# Patient Record
Sex: Male | Born: 1945 | Race: White | Hispanic: No | Marital: Married | Smoking: Former smoker
Health system: Southern US, Community
[De-identification: ages and names within clinical notes are randomized; demographics above are authoritative.]

## PROBLEM LIST (undated history)

## (undated) DIAGNOSIS — I10 Essential (primary) hypertension: Secondary | ICD-10-CM

## (undated) DIAGNOSIS — N429 Disorder of prostate, unspecified: Secondary | ICD-10-CM

## (undated) DIAGNOSIS — N189 Chronic kidney disease, unspecified: Secondary | ICD-10-CM

## (undated) DIAGNOSIS — R32 Unspecified urinary incontinence: Secondary | ICD-10-CM

## (undated) DIAGNOSIS — R011 Cardiac murmur, unspecified: Secondary | ICD-10-CM

## (undated) DIAGNOSIS — E119 Type 2 diabetes mellitus without complications: Secondary | ICD-10-CM

## (undated) DIAGNOSIS — C4492 Squamous cell carcinoma of skin, unspecified: Secondary | ICD-10-CM

## (undated) DIAGNOSIS — K219 Gastro-esophageal reflux disease without esophagitis: Secondary | ICD-10-CM

## (undated) DIAGNOSIS — I639 Cerebral infarction, unspecified: Secondary | ICD-10-CM

## (undated) DIAGNOSIS — L570 Actinic keratosis: Secondary | ICD-10-CM

## (undated) HISTORY — PX: SHOULDER SURGERY: SHX246

## (undated) HISTORY — DX: Actinic keratosis: L57.0

## (undated) HISTORY — DX: Disorder of prostate, unspecified: N42.9

## (undated) HISTORY — DX: Cerebral infarction, unspecified: I63.9

## (undated) HISTORY — DX: Type 2 diabetes mellitus without complications: E11.9

## (undated) HISTORY — DX: Cardiac murmur, unspecified: R01.1

## (undated) HISTORY — DX: Unspecified urinary incontinence: R32

## (undated) HISTORY — DX: Gastro-esophageal reflux disease without esophagitis: K21.9

## (undated) HISTORY — DX: Chronic kidney disease, unspecified: N18.9

## (undated) HISTORY — DX: Essential (primary) hypertension: I10

## (undated) HISTORY — DX: Squamous cell carcinoma of skin, unspecified: C44.92

---

## 2019-11-30 ENCOUNTER — Ambulatory Visit (INDEPENDENT_AMBULATORY_CARE_PROVIDER_SITE_OTHER): Payer: Medicare Other | Admitting: Family Medicine

## 2019-11-30 ENCOUNTER — Other Ambulatory Visit: Payer: Self-pay | Admitting: Family Medicine

## 2019-11-30 ENCOUNTER — Other Ambulatory Visit: Payer: Self-pay

## 2019-11-30 ENCOUNTER — Encounter: Payer: Self-pay | Admitting: Family Medicine

## 2019-11-30 VITALS — BP 120/79 | HR 77 | Temp 97.7°F | Resp 16 | Ht 69.0 in | Wt 187.0 lb

## 2019-11-30 DIAGNOSIS — L570 Actinic keratosis: Secondary | ICD-10-CM | POA: Diagnosis not present

## 2019-11-30 DIAGNOSIS — E1121 Type 2 diabetes mellitus with diabetic nephropathy: Secondary | ICD-10-CM | POA: Insufficient documentation

## 2019-11-30 DIAGNOSIS — N1831 Chronic kidney disease, stage 3a: Secondary | ICD-10-CM

## 2019-11-30 DIAGNOSIS — E1169 Type 2 diabetes mellitus with other specified complication: Secondary | ICD-10-CM

## 2019-11-30 DIAGNOSIS — Z1211 Encounter for screening for malignant neoplasm of colon: Secondary | ICD-10-CM

## 2019-11-30 DIAGNOSIS — N183 Chronic kidney disease, stage 3 unspecified: Secondary | ICD-10-CM

## 2019-11-30 DIAGNOSIS — Z85828 Personal history of other malignant neoplasm of skin: Secondary | ICD-10-CM

## 2019-11-30 DIAGNOSIS — K219 Gastro-esophageal reflux disease without esophagitis: Secondary | ICD-10-CM

## 2019-11-30 DIAGNOSIS — I129 Hypertensive chronic kidney disease with stage 1 through stage 4 chronic kidney disease, or unspecified chronic kidney disease: Secondary | ICD-10-CM | POA: Diagnosis not present

## 2019-11-30 DIAGNOSIS — N138 Other obstructive and reflux uropathy: Secondary | ICD-10-CM

## 2019-11-30 DIAGNOSIS — Z7689 Persons encountering health services in other specified circumstances: Secondary | ICD-10-CM

## 2019-11-30 MED ORDER — ESOMEPRAZOLE MAGNESIUM 20 MG PO CPDR: 20.0000 mg | DELAYED_RELEASE_CAPSULE | Freq: Every day | ORAL | 1 refills | Status: DC

## 2019-11-30 MED ORDER — TRIJARDY XR 25-5-1000 MG PO TB24: 1.0000 | ORAL_TABLET | Freq: Every day | ORAL | 1 refills | Status: DC

## 2019-11-30 MED ORDER — TRIJARDY XR 25-5-1000 MG PO TB24: 1.0000 | ORAL_TABLET | Freq: Every day | ORAL | 0 refills | Status: DC

## 2019-11-30 NOTE — Patient Instructions (Addendum)
Thank you for coming to the office today.  [place]   [address], [place] [address] Hours: 8AM-5PM Phone: [phone]  Sarina Ser, MD  -----------------------------------------------  [facility]) [address] [place], [place] [address] Phone: [phone]    Colon Cancer Screening: - For all adults age 75+ routine colon cancer screening is highly recommended.     - Recent guidelines from [place] recommend starting age of 47 - Early detection of colon cancer is important, because often there are no warning signs or symptoms, also if found early usually it can be cured. Late stage is hard to treat.  - If you are not interested in Colonoscopy screening (if done and normal you could be cleared for 5 to 10 years until next due), then Cologuard is an excellent alternative for screening test for Colon Cancer. It is highly sensitive for detecting DNA of colon cancer from even the earliest stages. Also, there is NO bowel prep required. - If Cologuard is NEGATIVE, then it is good for 3 years before next due - If Cologuard is POSITIVE, then it is strongly advised to get a Colonoscopy, which allows the GI doctor to locate the source of the cancer or polyp (even very early stage) and treat it by removing it. ------------------------- Ordered Cologuard Follow instructions to collect sample, you may call the company for any help or questions, 24/7 telephone support at [phone].  Mail order for both meds  AND local pharmacy [facility] for Trijardy 30 days  -----  Your provider would like to you have your annual eye exam. Please contact your current eye doctor or here are some good options for you to contact.   [facility]   Address: [address] [place], [place] [address] Phone: [phone]  Website: [email or web address] [address], [place], [place] [address] Phone:  [phone] [email or web address]  [facility]  Address: [place], [place], [place] [address] Phone: [phone]   [facility] [address] [place], [place] [address] Phone: [phone]  [facility] Address: [place], [place], [place] [address]  Phone: [phone]     DUE for [place] (no food or drink after midnight before the lab appointment, only water or coffee without cream/sugar on the morning of)  SCHEDULE "Lab Only" visit in the morning at the clinic for lab draw in [place]   - Make sure Lab Only appointment is at about 1 week before your next appointment, so that results will be available  For Lab Results, once available within 2-3 days of blood draw, you can can log in to MyChart online to view your results and a brief explanation. Also, we can discuss results at next follow-up visit.    Please schedule a Follow-up Appointment to: Return in about 4 weeks (around 12/28/2019) for Follow-up Lab Results / Diabetes, HTN.  If you have any other questions or concerns, please feel free to call the office or send a message through [place]. You may also schedule an earlier appointment if necessary.  Additionally, you may be receiving a survey about your experience at our office within a few days to 1 week by e-mail or mail. We value your feedback.  Nobie Putnam, DO [place]

## 2019-11-30 NOTE — Progress Notes (Signed)
Subjective:    Patient ID: Jorge Mayer, male    DOB: [birth year], 74 y.o.   MRN: [id number]  Jorge Mayer is a 74 y.o. male presenting on 11/30/2019 for Establish Care (HTN mild high)  Moved to [place] from [place] to be closer to his son who works for [employer], now lives with them, and his wife has retired.  HPI   CHRONIC DM, Type 2: Reports no concerns. Prior A1c 7.3, unsure date. Will request record CBGs: highest readings are in PM up to 247 - he uses a Dexcon constant glucose monitor Meds: Trijardy 25-5-1000mg  daily Reports good compliance. Tolerating well w/o side-effects Not on ACE ARB due to CKD per Renal Denies hypoglycemia, polyuria, visual changes, numbness or tingling.  Actinic Keratosis / History of SCC Scalp Previous Dermatology in [place] Request new referral locally. Was on 5FU for treatment PRN. Has several spots on scalp now.  GERD He was taking Nexium 40mg  in past. Now doing well on reduced dose Esomeprazole 20mg  (generic rx)  CHRONIC HTN with CKD-III Reports no new concerns, has had controlled BP. Was seeing Nephrology - request referral now locally, previously back in [place] - Not on any med   - Previously on Lisinopril but has been taken off of it. Denies CP, dyspnea, HA, edema, dizziness / lightheadedness  ED Tried PDE5. He describes decreased sensation, asking about other treatment options.  Health Maintenance:  Colon CA Screening: Never had colonoscopy or screening. Currently asymptomatic. No known family history of colon CA. Due for screening test  - will do cologuard  Prostate CA Screening: Prior PSA / DRE reported normal. Last PSA unavailable. Currently with mild post void dribble, and mild BPH symptoms LUTS. No known family history of prostate CA.  Due for screening.   Depression screen PHQ 2/9 11/30/2019  Decreased Interest 0  Down, Depressed, Hopeless 0  PHQ - 2 Score 0    Past Medical History:  Diagnosis Date  . Chronic  kidney disease   . GERD (gastroesophageal reflux disease)   . Hypertension   . Prostate disease   . Stroke ([place])   . Urinary incontinence    Past Surgical History:  Procedure Laterality Date  . SHOULDER SURGERY     Right side   Social History   Socioeconomic History  . Marital status: Married    Spouse name: Not on file  . Number of children: Not on file  . Years of education: Not on file  . Highest education level: Not on file  Occupational History  . Not on file  Tobacco Use  . Smoking status: Former Smoker    Types: Cigarettes  . Smokeless tobacco: Former Network engineer and Sexual Activity  . Alcohol use: Yes  . Drug use: Never  . Sexual activity: Not on file  Other Topics Concern  . Not on file  Social History Narrative  . Not on file   Social Determinants of Health   Financial Resource Strain:   . Difficulty of Paying Living Expenses:   Food Insecurity:   . Worried About Charity fundraiser in the Last Year:   . Arboriculturist in the Last Year:   Transportation Needs:   . Film/video editor (Medical):   Marland Kitchen Lack of Transportation (Non-Medical):   Physical Activity:   . Days of Exercise per Week:   . Minutes of Exercise per Session:   Stress:   . Feeling of Stress :   Social Connections:   .  Frequency of Communication with Friends and Family:   . Frequency of Social Gatherings with Friends and Family:   . Attends Religious Services:   . Active Member of Clubs or Organizations:   . Attends Archivist Meetings:   Marland Kitchen Marital Status:   Intimate Partner Violence:   . Fear of Current or Ex-Partner:   . Emotionally Abused:   Marland Kitchen Physically Abused:   . Sexually Abused:    Family History  Problem Relation Age of Onset  . Heart disease Mother   . Heart disease Father   . Lung cancer Brother 94   Current Outpatient Medications on File Prior to Visit  Medication Sig  . Specialty Vitamins Products (MAGNESIUM, AMINO ACID CHELATE,) 133 MG tablet  Take 1 tablet by mouth daily.   No current facility-administered medications on file prior to visit.    Review of Systems Per HPI unless specifically indicated above      Objective:    BP 120/79   Pulse 77   Temp 97.7 F (36.5 C) (Temporal)   Resp 16   Ht 5\' 9"  (1.753 m)   Wt 187 lb (84.8 kg)   SpO2 99%   BMI 27.62 kg/m   Wt Readings from Last 3 Encounters:  11/30/19 187 lb (84.8 kg)    Physical Exam Vitals and nursing note reviewed.  Constitutional:      General: He is not in acute distress.    Appearance: He is well-developed. He is not diaphoretic.     Comments: Well-appearing, comfortable, cooperative  HENT:     Head: Normocephalic and atraumatic.  Eyes:     General:        Right eye: No discharge.        Left eye: No discharge.     Conjunctiva/sclera: Conjunctivae normal.  Cardiovascular:     Rate and Rhythm: Normal rate.  Pulmonary:     Effort: Pulmonary effort is normal.  Skin:    General: Skin is warm and dry.     Findings: Lesion (scattered AKs on scalp dry scaly localized patches) present. No erythema or rash.  Neurological:     Mental Status: He is alert and oriented to person, place, and time.  Psychiatric:        Behavior: Behavior normal.     Comments: Well groomed, good eye contact, normal speech and thoughts    No results found for this or any previous visit.    Assessment & Plan:   Problem List Items Addressed This Visit    Type 2 diabetes mellitus with other specified complication ([place]) - Primary    Previously reported well controlled 123456 7 range Complications - CKDIII, GERD  Plan:  1. Continue current therapy - Trijardy 25-5-1000mg  daily - 30 day to local, and 90 day to mail order 2. Encourage improved lifestyle - low carb, low sugar diet, reduce portion size, continue improving regular exercise 3. Check CBG , bring log to next visit for review 4. Consult nephrology, ask about ACEi/ARB, and future Statin 5. Next visit will do DM  Foot exam / Advised to schedule DM ophtho exam, send record - handout given today 6. Follow-up 4 weeks labs A1c       Relevant Medications   TRIJARDY XR 25-11-998 MG TB24   Other Relevant Orders   Ambulatory referral to Nephrology   Stage 3a chronic kidney disease    See A&P HTN Referral to Nephrology      Relevant Orders   Ambulatory  referral to Nephrology   History of SCC (squamous cell carcinoma) of skin   Relevant Orders   Ambulatory referral to Dermatology   Benign hypertension with CKD (chronic kidney disease) stage III    Well-controlled HTN Complication with CKD III   Plan:  1. No medications - is on SGLT2 currently 2. Encourage improved lifestyle - low sodium diet, regular exercise 3. Start monitor BP outside office, bring readings to next visit, if persistently >140/90 or new symptoms notify office sooner  Referral to Nephrology [facility] by request, previously followed in [place], records requested  He is off ACEi/ARB - would ask Nephrology if can reconsider therapy if indicated      Relevant Orders   Ambulatory referral to Nephrology   AK (actinic keratosis)   Relevant Orders   Ambulatory referral to Dermatology    Other Visit Diagnoses    Encounter to establish care with new doctor       Gastroesophageal reflux disease without esophagitis       Relevant Medications   esomeprazole ([place]) 20 MG capsule      #Dermatology Multiple AKs on scalp Prior SCC, treated on scalp Reconsider topical 5FU therapy if needs refills, will defer to Derm can pursue cryotherapy that has worked in past Refer to [facility]  Due for routine colon cancer screening. Never had colonoscopy, no family history colon cancer. - Discussion today about recommendations for either Colonoscopy or Cologuard screening, benefits and risks of screening, interested in Cologuard, understands that if positive then recommendation is for diagnostic colonoscopy to follow-up. - Ordered  Cologuard today   Orders Placed This Encounter  Procedures  . Cologuard  . Ambulatory referral to Nephrology    Referral Priority:   Routine    Referral Type:   Consultation    Referral Reason:   Specialty Services Required    Requested Specialty:   Nephrology    Number of Visits Requested:   1  . Ambulatory referral to Dermatology    Referral Priority:   Routine    Referral Type:   Consultation    Referral Reason:   Specialty Services Required    Requested Specialty:   Dermatology    Number of Visits Requested:   1     Meds ordered this encounter  Medications  . DISCONTD: TRIJARDY XR 25-11-998 MG TB24    Sig: Take 1 tablet by mouth daily.    Dispense:  30 tablet    Refill:  0  . TRIJARDY XR 25-11-998 MG TB24    Sig: Take 1 tablet by mouth daily.    Dispense:  90 tablet    Refill:  1  . esomeprazole (NEXIUM) 20 MG capsule    Sig: Take 1 capsule (20 mg total) by mouth daily before breakfast.    Dispense:  90 capsule    Refill:  1     Follow up plan: Return in about 4 weeks (around 12/28/2019) for Follow-up Lab Results / Diabetes, HTN.   Future orders - CMET, A1c, PSA 12/21/19  Nobie Putnam, DO [facility] 11/30/2019, 10:36 AM

## 2019-11-30 NOTE — Assessment & Plan Note (Signed)
Previously reported well controlled 123456 7 range Complications - CKDIII, GERD  Plan:  1. Continue current therapy - Trijardy 25-5-1000mg  daily - 30 day to local, and 90 day to mail order 2. Encourage improved lifestyle - low carb, low sugar diet, reduce portion size, continue improving regular exercise 3. Check CBG , bring log to next visit for review 4. Consult nephrology, ask about ACEi/ARB, and future Statin 5. Next visit will do DM Foot exam / Advised to schedule DM ophtho exam, send record - handout given today 6. Follow-up 4 weeks labs A1c

## 2019-11-30 NOTE — Assessment & Plan Note (Signed)
Well-controlled HTN Complication with CKD III   Plan:  1. No medications - is on SGLT2 currently 2. Encourage improved lifestyle - low sodium diet, regular exercise 3. Start monitor BP outside office, bring readings to next visit, if persistently >140/90 or new symptoms notify office sooner  Referral to Nephrology [facility] by request, previously followed in [place], records requested  He is off ACEi/ARB - would ask Nephrology if can reconsider therapy if indicated

## 2019-11-30 NOTE — Assessment & Plan Note (Signed)
See A&P HTN Referral to Nephrology

## 2019-12-01 ENCOUNTER — Encounter: Payer: Self-pay | Admitting: Family Medicine

## 2019-12-11 LAB — COLOGUARD

## 2019-12-13 LAB — COLOGUARD: Cologuard: NEGATIVE

## 2019-12-21 ENCOUNTER — Other Ambulatory Visit: Payer: Self-pay

## 2019-12-21 ENCOUNTER — Encounter: Payer: Self-pay | Admitting: Family Medicine

## 2019-12-21 ENCOUNTER — Other Ambulatory Visit: Payer: 59

## 2019-12-21 DIAGNOSIS — N183 Chronic kidney disease, stage 3 unspecified: Secondary | ICD-10-CM

## 2019-12-21 DIAGNOSIS — N1831 Chronic kidney disease, stage 3a: Secondary | ICD-10-CM

## 2019-12-21 DIAGNOSIS — N138 Other obstructive and reflux uropathy: Secondary | ICD-10-CM

## 2019-12-21 DIAGNOSIS — N401 Enlarged prostate with lower urinary tract symptoms: Secondary | ICD-10-CM

## 2019-12-21 DIAGNOSIS — E1169 Type 2 diabetes mellitus with other specified complication: Secondary | ICD-10-CM

## 2019-12-21 DIAGNOSIS — I129 Hypertensive chronic kidney disease with stage 1 through stage 4 chronic kidney disease, or unspecified chronic kidney disease: Secondary | ICD-10-CM

## 2019-12-22 DIAGNOSIS — R972 Elevated prostate specific antigen [PSA]: Secondary | ICD-10-CM | POA: Insufficient documentation

## 2019-12-22 DIAGNOSIS — N401 Enlarged prostate with lower urinary tract symptoms: Secondary | ICD-10-CM | POA: Insufficient documentation

## 2019-12-22 DIAGNOSIS — R350 Frequency of micturition: Secondary | ICD-10-CM | POA: Insufficient documentation

## 2019-12-22 LAB — COMPLETE METABOLIC PANEL WITH GFR
AG Ratio: 1.6 (calc) (ref 1.0–2.5)
ALT: 12 U/L (ref 9–46)
AST: 14 U/L (ref 10–35)
Albumin: 4.1 g/dL (ref 3.6–5.1)
Alkaline phosphatase (APISO): 59 U/L (ref 35–144)
BUN/Creatinine Ratio: 17 (calc) (ref 6–22)
BUN: 24 mg/dL (ref 7–25)
CO2: 27 mmol/L (ref 20–32)
Calcium: 9.2 mg/dL (ref 8.6–10.3)
Chloride: 101 mmol/L (ref 98–110)
Creat: 1.41 mg/dL — ABNORMAL HIGH (ref 0.70–1.18)
GFR, Est African American: 56 mL/min/{1.73_m2} — ABNORMAL LOW (ref >=60)
GFR, Est Non African American: 49 mL/min/{1.73_m2} — ABNORMAL LOW (ref >=60)
Globulin: 2.6 g/dL (calc) (ref 1.9–3.7)
Glucose, Bld: 170 mg/dL — ABNORMAL HIGH (ref 65–99)
Potassium: 4.5 mmol/L (ref 3.5–5.3)
Sodium: 136 mmol/L (ref 135–146)
Total Bilirubin: 0.4 mg/dL (ref 0.2–1.2)
Total Protein: 6.7 g/dL (ref 6.1–8.1)

## 2019-12-22 LAB — PSA: PSA: 23.5 ng/mL — ABNORMAL HIGH (ref <=4.0)

## 2019-12-22 LAB — HEMOGLOBIN A1C
Hgb A1c MFr Bld: 6.8 % of total Hgb — ABNORMAL HIGH (ref <5.7)
Mean Plasma Glucose: 148 (calc)
eAG (mmol/L): 8.2 (calc)

## 2019-12-26 ENCOUNTER — Other Ambulatory Visit: Payer: Self-pay

## 2019-12-26 DIAGNOSIS — N400 Enlarged prostate without lower urinary tract symptoms: Secondary | ICD-10-CM

## 2019-12-26 LAB — HM DIABETES EYE EXAM

## 2019-12-26 NOTE — Progress Notes (Signed)
12/27/19 1:57 PM   Jorge Mayer 16-May-1946 [id number]  Referring provider: Olin Hauser, DO [address] [place],  [place] [address] Chief Complaint  Patient presents with  . Benign Prostatic Hypertrophy    HPI: Jorge Mayer is a 74 y.o. M who presents today for the evaluation and management of BPH and elevated PSA.   Most recent PSA 23.5 as of 12/21/19. He states of his PSA being this high for the first time and had a prostate bx 20 yrs ago in [place]. He moved to [place] a few months ago. He reports of his PSA normally being in the normal range.   He reports of nocturia x3, urgency, and frequency. He states of normal stream and feels like he empties his bladder. Symptoms not bothersome enough for treatment.   He reports of a UTI 2 years ago for which he was visited the hospital.   No FHx of prostate cancer.    IPSS    Row Name 12/27/19 1000         International Prostate Symptom Score   How often have you had the sensation of not emptying your bladder? More than half the time     How often have you had to urinate less than every two hours? About half the time     How often have you found you stopped and started again several times when you urinated? Not at All     How often have you found it difficult to postpone urination? About half the time     How often have you had a weak urinary stream? Less than 1 in 5 times     How often have you had to strain to start urination? Not at All     How many times did you typically get up at night to urinate? 3 Times     Total IPSS Score 14       Quality of Life due to urinary symptoms   If you were to spend the rest of your life with your urinary condition just the way it is now how would you feel about that? Mostly Disatisfied           Score:  1-7 Mild 8-19 Moderate 20-35 Severe  PMH: Past Medical History:  Diagnosis Date  . Chronic kidney disease   . GERD (gastroesophageal reflux disease)   . Hypertension   .  Prostate disease   . Stroke ([place])   . Urinary incontinence     Surgical History: Past Surgical History:  Procedure Laterality Date  . SHOULDER SURGERY     Right side    Home Medications:  Allergies as of 12/27/2019   No Known Allergies     Medication List       Accurate as of December 27, 2019  1:57 PM. If you have any questions, ask your nurse or doctor.        esomeprazole 20 MG capsule Commonly known as: NEXIUM Take 1 capsule (20 mg total) by mouth daily before breakfast.   magnesium (amino acid chelate) 133 MG tablet Take 1 tablet by mouth daily.   Trijardy XR 25-11-998 MG Tb24 Generic drug: Empagliflozin-Linaglip-Metform Take 1 tablet by mouth daily.       Allergies: No Known Allergies  Family History: Family History  Problem Relation Age of Onset  . Heart disease Mother   . Heart disease Father   . Lung cancer Brother 91    Social History:  reports  that he quit smoking about 45 years ago. His smoking use included cigarettes. He has a 10.00 pack-year smoking history. He has never used smokeless tobacco. He reports current alcohol use of about 1.0 standard drink of alcohol per week. He reports that he does not use drugs.   Physical Exam: BP (!) 165/97   Pulse 96   Ht 5\' 10"  (1.778 m)   Wt 184 lb (83.5 kg)   BMI 26.40 kg/m   Constitutional:  Alert and oriented, No acute distress. HEENT: [place] AT, moist mucus membranes.  Trachea midline, no masses. Cardiovascular: No clubbing, cyanosis, or edema. Respiratory: Normal respiratory effort, no increased work of breathing. Rectal: External hemorrhoid, prostate 50 g, no nodules or tenderness  Skin: No rashes, bruises or suspicious lesions. Neurologic: Grossly intact, no focal deficits, moving all 4 extremities. Psychiatric: Normal mood and affect.  Laboratory Data:  Lab Results  Component Value Date   CREATININE 1.41 (H) 12/21/2019    Lab Results  Component Value Date   PSA 23.5 (H) 12/21/2019    Lab  Results  Component Value Date   HGBA1C 6.8 (H) 12/21/2019    Urinalysis UA -10-30 white blood cells, moderate bacteria otherwise unremarkable.  No blood.  Interestingly, dip shows 2+ glucose but otherwise completely negative.  Pertinent Imaging: Results for orders placed or performed in visit on 12/27/19  BLADDER SCAN AMB NON-IMAGING  Result Value Ref Range   Scan Result 41 ML    Assessment & Plan:    1. Elevated PSA  PSA 23.5 as of 12/21/19  Urine culture to rule out infection although not suspected.  We reviewed the implications of an elevated PSA and the uncertainty surrounding it. In general, a man's PSA increases with age and is produced by both normal and cancerous prostate tissue. Differential for elevated PSA is BPH, prostate cancer, infection, recent intercourse/ejaculation, prostate infarction, recent urethroscopic manipulation (foley placement/cystoscopy) and prostatitis. Management of an elevated PSA can include observation or prostate biopsy and wediscussed this in detail.  We discussed that indications for prostate biopsy are defined by age and race specific PSA cutoffs as well as a PSA velocity of 0.75/year.  Will need prostate bx if PSA today remains elevated - pending   We discussed prostate biopsy in detail including the procedure itself, the risks of blood in the urine, stool, and ejaculate, serious infection, and discomfort. He is willing to proceed with this as discussed.  Will request records from [place]   Will call w/ PSA results   2. BPH w/ LUTS  Discussed pharmacal therapy versus intervention  Symptoms not bothersome enough for treatment  Adequate emptying of bladder    Tentatively scheduled for prostate biopsy, cancel and reschedule as needed  [facility] [address], [place], [place] [address] [phone]  I, Lucas Mallow, am acting as a scribe for Dr. Hollice Espy,  I have reviewed the above  documentation for accuracy and completeness, and I agree with the above.   Hollice Espy, MD  I spent 45 total minutes on the day of the encounter including pre-visit review of the medical record, face-to-face time with the patient, and post visit ordering of labs/imaging/tests.

## 2019-12-27 ENCOUNTER — Ambulatory Visit (INDEPENDENT_AMBULATORY_CARE_PROVIDER_SITE_OTHER): Payer: Medicare Other | Admitting: Urology

## 2019-12-27 ENCOUNTER — Encounter: Payer: Self-pay | Admitting: Urology

## 2019-12-27 ENCOUNTER — Other Ambulatory Visit: Payer: Self-pay

## 2019-12-27 VITALS — BP 165/97 | HR 96 | Ht 70.0 in | Wt 184.0 lb

## 2019-12-27 DIAGNOSIS — N401 Enlarged prostate with lower urinary tract symptoms: Secondary | ICD-10-CM | POA: Diagnosis not present

## 2019-12-27 DIAGNOSIS — R350 Frequency of micturition: Secondary | ICD-10-CM

## 2019-12-27 DIAGNOSIS — N400 Enlarged prostate without lower urinary tract symptoms: Secondary | ICD-10-CM | POA: Diagnosis not present

## 2019-12-27 DIAGNOSIS — R972 Elevated prostate specific antigen [PSA]: Secondary | ICD-10-CM

## 2019-12-27 LAB — BLADDER SCAN AMB NON-IMAGING: Scan Result: 41

## 2019-12-27 NOTE — Patient Instructions (Signed)

## 2019-12-28 ENCOUNTER — Other Ambulatory Visit: Payer: Self-pay

## 2019-12-28 ENCOUNTER — Other Ambulatory Visit: Payer: Self-pay | Admitting: Family Medicine

## 2019-12-28 ENCOUNTER — Encounter: Payer: Self-pay | Admitting: Family Medicine

## 2019-12-28 ENCOUNTER — Telehealth: Payer: Self-pay | Admitting: *Deleted

## 2019-12-28 ENCOUNTER — Ambulatory Visit (INDEPENDENT_AMBULATORY_CARE_PROVIDER_SITE_OTHER): Payer: Medicare Other | Admitting: Family Medicine

## 2019-12-28 VITALS — BP 118/57 | HR 64 | Temp 97.3°F | Resp 16 | Ht 70.0 in | Wt 185.0 lb

## 2019-12-28 DIAGNOSIS — E1136 Type 2 diabetes mellitus with diabetic cataract: Secondary | ICD-10-CM | POA: Diagnosis not present

## 2019-12-28 DIAGNOSIS — I129 Hypertensive chronic kidney disease with stage 1 through stage 4 chronic kidney disease, or unspecified chronic kidney disease: Secondary | ICD-10-CM

## 2019-12-28 DIAGNOSIS — Z1159 Encounter for screening for other viral diseases: Secondary | ICD-10-CM

## 2019-12-28 DIAGNOSIS — N401 Enlarged prostate with lower urinary tract symptoms: Secondary | ICD-10-CM

## 2019-12-28 DIAGNOSIS — R972 Elevated prostate specific antigen [PSA]: Secondary | ICD-10-CM

## 2019-12-28 DIAGNOSIS — E1169 Type 2 diabetes mellitus with other specified complication: Secondary | ICD-10-CM

## 2019-12-28 DIAGNOSIS — N183 Chronic kidney disease, stage 3 unspecified: Secondary | ICD-10-CM

## 2019-12-28 DIAGNOSIS — R350 Frequency of micturition: Secondary | ICD-10-CM

## 2019-12-28 LAB — URINALYSIS, COMPLETE
Bilirubin, UA: NEGATIVE
Ketones, UA: NEGATIVE
Leukocytes,UA: NEGATIVE
Nitrite, UA: NEGATIVE
Protein,UA: NEGATIVE
RBC, UA: NEGATIVE
Specific Gravity, UA: 1.015 (ref 1.005–1.030)
Urobilinogen, Ur: 0.2 mg/dL (ref 0.2–1.0)
pH, UA: 5 (ref 5.0–7.5)

## 2019-12-28 LAB — MICROSCOPIC EXAMINATION: RBC, Urine: NONE SEEN /hpf (ref 0–2)

## 2019-12-28 LAB — PSA: Prostate Specific Ag, Serum: 9.9 ng/mL — ABNORMAL HIGH (ref 0.0–4.0)

## 2019-12-28 LAB — POCT UA - MICROALBUMIN: Microalbumin Ur, POC: 0 mg/L

## 2019-12-28 NOTE — Assessment & Plan Note (Addendum)
Well-controlled HTN Complication with CKD III   Plan:  1. No medications - is on SGLT2 currently 2. Encourage improved lifestyle - low sodium diet, regular exercise 3. Monitor BP outside office, bring readings to next visit, if persistently >140/90 or new symptoms notify office sooner  Awaiting apt with Nephrology [facility] by request, previously followed in [place], records requested  Today urine microalbumin is 0 He is off ACEi/ARB - would ask Nephrology if can reconsider therapy if indicated

## 2019-12-28 NOTE — Telephone Encounter (Addendum)
Patient informed, rescheduled follow up and lab visit. Patient aware.   ----- Message from Hollice Espy, MD sent at 12/28/2019  3:47 PM EDT ----- PSA is still markedly elevated but coming way down.  Let us continue to see how this trends.  Like you to come back in 3 months with a PSA prior rather than pursuing biopsy at this time.  Please reschedule visit appropriately.  Hollice Espy, MD

## 2019-12-28 NOTE — Progress Notes (Signed)
Subjective:    Patient ID: Jorge Mayer, male    DOB: [birth year], 74 y.o.   MRN: [id number]  Jorge Mayer is a 74 y.o. male presenting on 12/28/2019 for Hypertension   HPI   Follow-up Elevated PSA Last visit 11/2019. He had interval labs. Showed significant elevated PSA at 23.5. No comparison result. He was referred to [facility]. He had repeat PSA on 12/27/19 at 9.9. Urology has plan to follow PSA and anticipate prostate biopsy if indicated. They did DRE as well without abnormal finding. Records had been requested he had prior prostate biopsy years ago. No significant BPH symptoms - only mild. No new concerns today. He will f/u with Urology  CHRONIC DM, Type 2: He is doing well Last A1c 6.8 CBGs: highest readings are in PM up to < 250 he uses a Dexcon 6 constant glucose monitor Meds: Trijardy 25-5-1000mg  daily (Jardiance 25 / Tradjenta 5 / metformin 1000 - combo pill) Reports good compliance. Tolerating well w/o side-effects Not on ACE ARB due to CKD per Renal - awaiting upcoming appointment Urine microalbumin due today He has history of cataracts Denies hypoglycemia, polyuria, visual changes, numbness or tingling.  CHRONIC HTN with CKD-III Reports no new concerns, has had controlled BP. Was seeing Nephrology - request referral now locally, previously back in [place]. He is awaiting apt. Current Meds - Not on any med   - Previously on Lisinopril but has been taken off of it. Denies CP, dyspnea, HA, edema, dizziness / lightheadedness   Depression screen [facility] 2/9 11/30/2019  Decreased Interest 0  Down, Depressed, Hopeless 0  PHQ - 2 Score 0    Social History   Tobacco Use  . Smoking status: Former Smoker    Packs/day: 1.00    Years: 10.00    Pack years: 10.00    Types: Cigarettes    Quit date: 07/06/1974    Years since quitting: 45.5  . Smokeless tobacco: Never Used  Substance Use Topics  . Alcohol use: Yes    Alcohol/week: 1.0 standard drink    Types: 1 Standard  drinks or equivalent per week  . Drug use: Never    Review of Systems Per HPI unless specifically indicated above     Objective:    BP (!) 118/57   Pulse 64   Temp (!) 97.3 F (36.3 C) (Temporal)   Resp 16   Ht 5\' 10"  (1.778 m)   Wt 185 lb (83.9 kg)   SpO2 100%   BMI 26.54 kg/m   Wt Readings from Last 3 Encounters:  12/28/19 185 lb (83.9 kg)  12/27/19 184 lb (83.5 kg)  11/30/19 187 lb (84.8 kg)    Physical Exam Vitals and nursing note reviewed.  Constitutional:      General: He is not in acute distress.    Appearance: He is well-developed. He is not diaphoretic.     Comments: Well-appearing, comfortable, cooperative  HENT:     Head: Normocephalic and atraumatic.  Eyes:     General:        Right eye: No discharge.        Left eye: No discharge.     Conjunctiva/sclera: Conjunctivae normal.  Cardiovascular:     Rate and Rhythm: Normal rate.  Pulmonary:     Effort: Pulmonary effort is normal.  Skin:    General: Skin is warm and dry.     Findings: No erythema or rash.  Neurological:     Mental Status: He is alert and  oriented to person, place, and time.  Psychiatric:        Behavior: Behavior normal.     Comments: Well groomed, good eye contact, normal speech and thoughts        Diabetic Foot Exam - Simple   Simple Foot Form Diabetic Foot exam was performed with the following findings: Yes 12/28/2019 11:15 AM  Visual Inspection See comments: Yes Sensation Testing See comments: Yes Pulse Check Posterior Tibialis and Dorsalis pulse intact bilaterally: Yes Comments Areas of mild reduced monofilament sensation forefoot bilateral and great toes bilateral. Several areas of callus formation heels and forefoot. 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    Recent Labs    12/21/19 0857  HGBA1C 6.8*     Chemistry      Component Value Date/Time   NA 136 12/21/2019 0857   K 4.5 12/21/2019 0857   CL 101 12/21/2019 0857   CO2 27 12/21/2019 0857   BUN 24  12/21/2019 0857   CREATININE 1.41 (H) 12/21/2019 0857      Component Value Date/Time   CALCIUM 9.2 12/21/2019 0857   AST 14 12/21/2019 0857   ALT 12 12/21/2019 0857   BILITOT 0.4 12/21/2019 0857     Results for orders placed or performed in visit on 12/28/19  POCT UA - Microalbumin  Result Value Ref Range   Microalbumin Ur, POC 0 mg/L      Assessment & Plan:   Problem List Items Addressed This Visit    Type 2 diabetes mellitus with other specified complication ([place]) - Primary    Well controlled T2DM A1c 6.8 currently Complications - CKDIII, GERD, cataracts  Plan:  1. Continue current therapy - Trijardy 25-5-1000mg  daily 2. Encourage improved lifestyle - low carb, low sugar diet, reduce portion size, continue improving regular exercise 3. Check CBG , bring log to next visit for review 4. Awaiting apt with nephrology, ask about ACEi/ARB - - Urine microalbumin today 0 - Check future lipid - will discuss rec for future Statin 5. UTD DM Foot exam today / Already completed [facility] [place] awaiting record.      Relevant Orders   POCT UA - Microalbumin (Completed)   Diabetic cataract of both eyes ([place])    Complication of DM      Benign hypertension with CKD (chronic kidney disease) stage III    Well-controlled HTN Complication with CKD III   Plan:  1. No medications - is on SGLT2 currently 2. Encourage improved lifestyle - low sodium diet, regular exercise 3. Monitor BP outside office, bring readings to next visit, if persistently >140/90 or new symptoms notify office sooner  Awaiting apt with Nephrology [facility] by request, previously followed in [place], records requested  Today urine microalbumin is 0 He is off ACEi/ARB - would ask Nephrology if can reconsider therapy if indicated         No orders of the defined types were placed in this encounter.   Follow up plan: Return in about 3 months (around 03/29/2020) for Yearly Medicare Checkup.  Future labs ordered for  03/26/20 added TSH Hep C   Nobie Putnam, DO [facility] 12/28/2019, 11:02 AM

## 2019-12-28 NOTE — Assessment & Plan Note (Signed)
Complication of DM 

## 2019-12-28 NOTE — Assessment & Plan Note (Addendum)
Well controlled T2DM A1c 6.8 currently Complications - CKDIII, GERD, cataracts  Plan:  1. Continue current therapy - Trijardy 25-5-1000mg  daily 2. Encourage improved lifestyle - low carb, low sugar diet, reduce portion size, continue improving regular exercise 3. Check CBG , bring log to next visit for review 4. Awaiting apt with nephrology, ask about ACEi/ARB - - Urine microalbumin today 0 - Check future lipid - will discuss rec for future Statin 5. UTD DM Foot exam today / Already completed [facility] [place] awaiting record.

## 2019-12-28 NOTE — Patient Instructions (Addendum)
Thank you for coming to the office today.  Foot check today, let me know if interested to see Podiatry in future if toes are affecting your balance.  Recent Labs    12/21/19 0857  HGBA1C 6.8*   Follow up with kidney specialist as planned.  Keep on current med regimen.  Follow up with Urologist on prostate plans if need biopsy   DUE for FASTING BLOOD WORK (no food or drink after midnight before the lab appointment, only water or coffee without cream/sugar on the morning of)  SCHEDULE "Lab Only" visit in the morning at the clinic for lab draw in 3 MONTHS   - Make sure Lab Only appointment is at about 1 week before your next appointment, so that results will be available  For Lab Results, once available within 2-3 days of blood draw, you can can log in to MyChart online to view your results and a brief explanation. Also, we can discuss results at next follow-up visit.    Please schedule a Follow-up Appointment to: Return in about 3 months (around 03/29/2020) for Yearly Medicare Checkup.  If you have any other questions or concerns, please feel free to call the office or send a message through [place]. You may also schedule an earlier appointment if necessary.  Additionally, you may be receiving a survey about your experience at our office within a few days to 1 week by e-mail or mail. We value your feedback.  Nobie Putnam, DO [place]

## 2019-12-30 LAB — CULTURE, URINE COMPREHENSIVE

## 2020-01-01 ENCOUNTER — Telehealth: Payer: Self-pay | Admitting: *Deleted

## 2020-01-01 MED ORDER — NITROFURANTOIN MONOHYD MACRO 100 MG PO CAPS
100.0000 mg | ORAL_CAPSULE | Freq: Two times a day (BID) | ORAL | 0 refills | Status: DC
Start: 2020-01-01 — End: 2020-01-26

## 2020-01-01 NOTE — Telephone Encounter (Signed)
Patient notified and script sent, patient is out of town sent to a local pharmacy per his request

## 2020-01-01 NOTE — Telephone Encounter (Addendum)
Left Vm to return my call, no DPR on file.   ----- Message from Hollice Espy, MD sent at 12/31/2019 11:28 AM EDT ----- Please treat this urine culture with macrobid 100 mg bid x 10 days.  This may be contributing to rise in PSA.  Hollice Espy, MD

## 2020-01-11 ENCOUNTER — Other Ambulatory Visit: Payer: Self-pay | Admitting: Nephrology

## 2020-01-11 DIAGNOSIS — N1831 Chronic kidney disease, stage 3a: Secondary | ICD-10-CM

## 2020-01-11 DIAGNOSIS — E1122 Type 2 diabetes mellitus with diabetic chronic kidney disease: Secondary | ICD-10-CM

## 2020-01-18 ENCOUNTER — Other Ambulatory Visit: Payer: Self-pay

## 2020-01-18 ENCOUNTER — Ambulatory Visit
Admission: RE | Admit: 2020-01-18 | Discharge: 2020-01-18 | Disposition: A | Discharge status: Home / Self Care | Payer: Medicare Other | Source: Ambulatory Visit | Attending: Nephrology | Admitting: Nephrology

## 2020-01-18 DIAGNOSIS — E1122 Type 2 diabetes mellitus with diabetic chronic kidney disease: Secondary | ICD-10-CM

## 2020-01-18 DIAGNOSIS — N1831 Chronic kidney disease, stage 3a: Secondary | ICD-10-CM | POA: Insufficient documentation

## 2020-01-23 ENCOUNTER — Other Ambulatory Visit: Payer: 59 | Admitting: Urology

## 2020-01-25 NOTE — Progress Notes (Signed)
01/26/2020 3:45 PM   Jorge Mayer Aug 13, 1945 [id number]  Referring provider: Anthonette Legato, MD [place],  [place] [address] Chief Complaint  Patient presents with  . Follow-up    possible bladder diverticulum    HPI: Jorge Mayer is a 74 y.o. male with and elevated PSA and BPH with LUTS returns today for follow-up and to discuss possible bladder diverticulum following recent RUS.    PSA was 23.5 on 12/21/19. He states of his PSA being this high for the first time and had a prostate bx 20 yrs ago in [place]. He moved to [place] a few months ago. He reports of his PSA normally being in the normal range. Most recent PSA is 9.9 as of 12/27/2019.   Renal ultrasound on 01/18/2020 showed no acute renal abnormality.  Negative for hydronephrosis. There was a 2.3 cm right posterior bladder wall cystic or fluid filled structure, nonspecific. Query dilated right distal ureter/UVJ or bladder diverticulum.  PSA trend: 12/21/2019: 23.5 12/27/2019: 9.9  Patient is doing well. His stream is slowed down to a dribble while on an antihistamine.    PMH: Past Medical History:  Diagnosis Date  . Chronic kidney disease   . GERD (gastroesophageal reflux disease)   . Hypertension   . Prostate disease   . Stroke ([place])   . Urinary incontinence     Surgical History: Past Surgical History:  Procedure Laterality Date  . SHOULDER SURGERY     Right side    Home Medications:  Allergies as of 01/26/2020   No Known Allergies     Medication List       Accurate as of January 26, 2020  3:45 PM. If you have any questions, ask your nurse or doctor.        STOP taking these medications   nitrofurantoin (macrocrystal-monohydrate) 100 MG capsule Commonly known as: Macrobid Stopped by: Hollice Espy, MD     TAKE these medications   esomeprazole 20 MG capsule Commonly known as: NEXIUM Take 1 capsule (20 mg total) by mouth daily before breakfast.   magnesium (amino acid  chelate) 133 MG tablet Take 1 tablet by mouth daily.   Trijardy XR 25-11-998 MG Tb24 Generic drug: Empagliflozin-Linaglip-Metform Take 1 tablet by mouth daily.       Allergies: No Known Allergies  Family History: Family History  Problem Relation Age of Onset  . Heart disease Mother   . Heart disease Father   . Lung cancer Brother 11    Social History:  reports that he quit smoking about 45 years ago. His smoking use included cigarettes. He has a 10.00 pack-year smoking history. He has never used smokeless tobacco. He reports current alcohol use of about 1.0 standard drink of alcohol per week. He reports that he does not use drugs.   Physical Exam: BP (!) 150/84   Pulse 105   Ht 5\' 10"  (1.778 m)   Wt 186 lb (84.4 kg)   BMI 26.69 kg/m   Constitutional:  Alert and oriented, No acute distress. HEENT: [place] AT, moist mucus membranes.  Trachea midline, no masses. Cardiovascular: No clubbing, cyanosis, or edema. Respiratory: Normal respiratory effort, no increased work of breathing. Skin: No rashes, bruises or suspicious lesions. Neurologic: Grossly intact, no focal deficits, moving all 4 extremities. Psychiatric: Normal mood and affect.  Laboratory Data:  Lab Results  Component Value Date   CREATININE 1.41 (H) 12/21/2019    Lab Results  Component Value Date   PSA  23.5 (H) 12/21/2019    No results found for: TESTOSTERONE  Lab Results  Component Value Date   HGBA1C 6.8 (H) 12/21/2019     Pertinent Imaging: Results for orders placed during the hospital encounter of 01/18/20  US RENAL  Narrative CLINICAL DATA:  Chronic kidney disease stage 3, diabetes, recent UTI  EXAM: RENAL / URINARY TRACT ULTRASOUND COMPLETE  COMPARISON:  None.  FINDINGS: Right Kidney:  Renal measurements: 11.0 x 5.3 x 4.9 cm = volume: 150 mL . Echogenicity within normal limits. No mass or hydronephrosis visualized.  Left Kidney:  Renal measurements: 11.0 x 5.2 x 4.8 cm = volume:  143 mL. Echogenicity within normal limits. No mass or hydronephrosis visualized.  Bladder:  Appears normal for degree of bladder distention.  Other:  Posterior to the bladder and slightly to the right there is a cystic or fluid filled structure measuring 2.3 cm by ultrasound. This is nonspecific but could represent a dilated right distal UVJ or bladder diverticulum.  IMPRESSION: No acute renal abnormality.  Negative for hydronephrosis.  2.3 cm right posterior bladder wall cystic or fluid filled structure, nonspecific. Query dilated right distal ureter/UVJ or bladder diverticulum.   Electronically Signed By: Jerilynn Mages.  Shick M.D. On: 01/19/2020 10:21   I have personally reviewed the images and agree with radiologist interpretation.  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    Assessment & Plan:    1. Elevated PSA PSA was 9.9 as of 12/27/2019, trending down Repeat in 2 months as schedule Still awaiting records, replaced again today  2. 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 Hollice Espy.  I have reviewed the above documentation for accuracy and completeness, and I agree with the above.   Hollice Espy, MD  I spent 30 total minutes on the day of the encounter including pre-visit review of the medical record, face-to-face time with the patient, and post visit ordering of labs/imaging/tests.

## 2020-01-26 ENCOUNTER — Other Ambulatory Visit: Payer: Self-pay

## 2020-01-26 ENCOUNTER — Ambulatory Visit (INDEPENDENT_AMBULATORY_CARE_PROVIDER_SITE_OTHER): Payer: Medicare Other | Admitting: Urology

## 2020-01-26 ENCOUNTER — Encounter: Payer: Self-pay | Admitting: Urology

## 2020-01-26 VITALS — BP 150/84 | HR 105 | Ht 70.0 in | Wt 186.0 lb

## 2020-01-26 DIAGNOSIS — R972 Elevated prostate specific antigen [PSA]: Secondary | ICD-10-CM

## 2020-01-26 DIAGNOSIS — N400 Enlarged prostate without lower urinary tract symptoms: Secondary | ICD-10-CM | POA: Diagnosis not present

## 2020-01-26 DIAGNOSIS — N323 Diverticulum of bladder: Secondary | ICD-10-CM

## 2020-01-31 ENCOUNTER — Ambulatory Visit: Payer: 59 | Admitting: Urology

## 2020-02-20 ENCOUNTER — Ambulatory Visit: Payer: Medicare Other | Admitting: Urology

## 2020-03-07 ENCOUNTER — Ambulatory Visit (INDEPENDENT_AMBULATORY_CARE_PROVIDER_SITE_OTHER): Payer: Medicare Other | Admitting: Dermatology

## 2020-03-07 ENCOUNTER — Other Ambulatory Visit: Payer: Self-pay

## 2020-03-07 DIAGNOSIS — L719 Rosacea, unspecified: Secondary | ICD-10-CM | POA: Diagnosis not present

## 2020-03-07 DIAGNOSIS — Z85828 Personal history of other malignant neoplasm of skin: Secondary | ICD-10-CM

## 2020-03-07 DIAGNOSIS — L821 Other seborrheic keratosis: Secondary | ICD-10-CM | POA: Diagnosis not present

## 2020-03-07 DIAGNOSIS — Z872 Personal history of diseases of the skin and subcutaneous tissue: Secondary | ICD-10-CM

## 2020-03-07 DIAGNOSIS — L578 Other skin changes due to chronic exposure to nonionizing radiation: Secondary | ICD-10-CM

## 2020-03-07 DIAGNOSIS — L570 Actinic keratosis: Secondary | ICD-10-CM | POA: Diagnosis not present

## 2020-03-07 NOTE — Patient Instructions (Signed)

## 2020-03-07 NOTE — Progress Notes (Signed)
   New Patient Visit  Referral from Dr Dewaine Oats  Subjective  Jorge Mayer is a 74 y.o. male who presents for the following: Other (Scaly spots of scalp x several months. History of AKs and SCC of scalp treated in [place]).  The following portions of the chart were reviewed this encounter and updated as appropriate:  Tobacco  Allergies  Meds  Problems  Med Hx  Surg Hx  Fam Hx     Review of Systems:  No other skin or systemic complaints except as noted in HPI or Assessment and Plan.  Objective  Well appearing patient in no apparent distress; mood and affect are within normal limits.  A focused examination was performed including scalp, face, arms. Relevant physical exam findings are noted in the Assessment and Plan.  Objective  Scalp (3): Erythematous thin papules/macules with gritty scale.   Objective  Head - Anterior (Face): Erythema and dilated blood vessels.   Assessment & Plan    Actinic Damage - diffuse scaly erythematous macules with underlying dyspigmentation - Recommend daily broad spectrum sunscreen SPF 30+ to sun-exposed areas, reapply every 2 hours as needed.  - Call for new or changing lesions.  Seborrheic Keratoses - Stuck-on, waxy, tan-brown papules and plaques  - Discussed benign etiology and prognosis. - Observe - Call for any changes   AK (actinic keratosis) (3) Scalp  May consider field treatment in the future if needed.  Destruction of lesion - Scalp Complexity: simple   Destruction method: cryotherapy   Informed consent: discussed and consent obtained   Timeout:  patient name, date of birth, surgical site, and procedure verified Lesion destroyed using liquid nitrogen: Yes   Region frozen until ice ball extended beyond lesion: Yes   Outcome: patient tolerated procedure well with no complications   Post-procedure details: wound care instructions given    Rosacea Head - Anterior (Face)  Discussed BBL  Return in about 1  year (around 03/07/2021).   I, Ashok Cordia, CMA, am acting as scribe for Sarina Ser, MD .  Documentation: I have reviewed the above documentation for accuracy and completeness, and I agree with the above.  Sarina Ser, MD

## 2020-03-13 ENCOUNTER — Encounter: Payer: Self-pay | Admitting: Dermatology

## 2020-03-26 ENCOUNTER — Other Ambulatory Visit: Payer: Medicare Other

## 2020-03-28 ENCOUNTER — Other Ambulatory Visit: Payer: Self-pay | Admitting: Family Medicine

## 2020-03-28 DIAGNOSIS — E1169 Type 2 diabetes mellitus with other specified complication: Secondary | ICD-10-CM

## 2020-03-28 DIAGNOSIS — Z1159 Encounter for screening for other viral diseases: Secondary | ICD-10-CM

## 2020-03-28 DIAGNOSIS — N183 Chronic kidney disease, stage 3 unspecified: Secondary | ICD-10-CM

## 2020-03-29 ENCOUNTER — Other Ambulatory Visit: Payer: Self-pay

## 2020-03-29 ENCOUNTER — Other Ambulatory Visit: Payer: Medicare Other

## 2020-03-29 DIAGNOSIS — R972 Elevated prostate specific antigen [PSA]: Secondary | ICD-10-CM

## 2020-03-30 LAB — PSA: Prostate Specific Ag, Serum: 2.2 ng/mL (ref 0.0–4.0)

## 2020-03-30 NOTE — Progress Notes (Signed)
04/02/2020 12:42 PM   [name] 1946/04/30 [id number]  Referring provider: Olin Hauser, DO [address] [place],  [place] [address] Chief Complaint  Patient presents with  . Elevated PSA    HPI: Jorge Mayer is a 74 y.o. male who returns for a 2 month follow up of elevated PSA, BH with LUTS, and bladder diverticulum.   PSA was 23.5 on 12/21/19.He states of his PSA being this high for the first time and had a prostate bx 20 yrs ago in [place]. He moved to [place] a few months ago. He reports of his PSA normally being in the normal range. Recent PSA 2.2 as of 03/29/2020.   Renal ultrasound on 01/18/2020 showed no acute renal abnormality. Negative for hydronephrosis. There was a 2.3 cm right posterior bladder wall cystic or fluid filled structure, nonspecific. Query dilated right distal ureter/UVJ or bladder diverticulum.  Received and reviewed records today.   He reports urinary frequency. He had nocturia x 3-4 and now it has improved to 2-3.   His brother had lung cancer. He notes a family history of cancer.   PSA trend: 06/2019: 3.2 12/21/2019: 23.5 12/27/2019: 9.9 03/29/2020: 2.2  PMH: Past Medical History:  Diagnosis Date  . Chronic kidney disease   . GERD (gastroesophageal reflux disease)   . Hypertension   . Prostate disease   . Stroke ([place])   . Urinary incontinence     Surgical History: Past Surgical History:  Procedure Laterality Date  . SHOULDER SURGERY     Right side    Home Medications:  Allergies as of 04/02/2020   No Known Allergies     Medication List       Accurate as of April 02, 2020 11:59 PM. If you have any questions, ask your nurse or doctor.        Dexcom G6 Sensor Misc SMARTSIG:1 Each Topical Every 10 Days   Dexcom G6 Transmitter Misc   esomeprazole 20 MG capsule Commonly known as: NEXIUM Take 1 capsule (20 mg total) by mouth daily before breakfast.   magnesium (amino acid chelate) 133 MG tablet Take 1  tablet by mouth daily. As per pt takes 400 mg  once daily.   Magnesium 400 MG Tabs Take by mouth.   rosuvastatin 10 MG tablet Commonly known as: Crestor Take 1 tablet (10 mg total) by mouth at bedtime. Started by: Nobie Putnam, DO   Trijardy XR 25-11-998 MG Tb24 Generic drug: Empagliflozin-Linaglip-Metform Take 1 tablet by mouth daily.       Allergies: No Known Allergies  Family History: Family History  Problem Relation Age of Onset  . Heart disease Mother   . Heart disease Father   . Lung cancer Brother 69    Social History:  reports that he quit smoking about 45 years ago. His smoking use included cigarettes. He has a 10.00 pack-year smoking history. He has never used smokeless tobacco. He reports current alcohol use of about 1.0 standard drink of alcohol per week. He reports that he does not use drugs.   Physical Exam: BP 133/80   Pulse 78   Ht 5\' 9"  (1.753 m)   Wt 185 lb (83.9 kg)   BMI 27.32 kg/m   Constitutional:  Alert and oriented, No acute distress. HEENT: [place] AT, moist mucus membranes.  Trachea midline, no masses. Cardiovascular: No clubbing, cyanosis, or edema. Respiratory: Normal respiratory effort, no increased work of breathing. Skin: No rashes, bruises or suspicious lesions. Neurologic: Grossly intact, no  focal deficits, moving all 4 extremities. Psychiatric: Normal mood and affect.  Laboratory Data:  Lab Results  Component Value Date   CREATININE 1.40 (H) 03/29/2020    Lab Results  Component Value Date   PSA 23.5 (H) 12/21/2019    Lab Results  Component Value Date   HGBA1C 6.9 (H) 03/29/2020    Assessment & Plan:    1. Elevated PSA PSA has normalized to 2.2 as of 03/29/2020. Will defer PSA screenings based on AUA guidelines.  2. Nocturia/urinary frequency Improved and not bothersome at this time.   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   F/u prn  [facility] [address] [place], [place] [address] [phone]  I, Selena Batten, am acting as a scribe for Dr. Hollice Espy.  I have reviewed the above documentation for accuracy and completeness, and I agree with the above.   Hollice Espy, MD

## 2020-04-01 LAB — CBC WITH DIFFERENTIAL/PLATELET
Absolute Monocytes: 870 cells/uL (ref 200–950)
Basophils Absolute: 44 cells/uL (ref 0–200)
Basophils Relative: 0.5 %
Eosinophils Absolute: 209 cells/uL (ref 15–500)
Eosinophils Relative: 2.4 %
HCT: 48.8 % (ref 38.5–50.0)
Hemoglobin: 16 g/dL (ref 13.2–17.1)
Lymphs Abs: 1531 cells/uL (ref 850–3900)
MCH: 29 pg (ref 27.0–33.0)
MCHC: 32.8 g/dL (ref 32.0–36.0)
MCV: 88.4 fL (ref 80.0–100.0)
MPV: 10.6 fL (ref 7.5–12.5)
Monocytes Relative: 10 %
Neutro Abs: 6047 cells/uL (ref 1500–7800)
Neutrophils Relative %: 69.5 %
Platelets: 207 10*3/uL (ref 140–400)
RBC: 5.52 10*6/uL (ref 4.20–5.80)
RDW: 13 % (ref 11.0–15.0)
Total Lymphocyte: 17.6 %
WBC: 8.7 10*3/uL (ref 3.8–10.8)

## 2020-04-01 LAB — LIPID PANEL
Cholesterol: 226 mg/dL — ABNORMAL HIGH (ref <200)
HDL: 73 mg/dL (ref >=40)
LDL Cholesterol (Calc): 135 mg/dL (calc) — ABNORMAL HIGH
Non-HDL Cholesterol (Calc): 153 mg/dL (calc) — ABNORMAL HIGH (ref <130)
Total CHOL/HDL Ratio: 3.1 (calc) (ref <5.0)
Triglycerides: 84 mg/dL (ref <150)

## 2020-04-01 LAB — COMPLETE METABOLIC PANEL WITH GFR
AG Ratio: 2.1 (calc) (ref 1.0–2.5)
ALT: 16 U/L (ref 9–46)
AST: 19 U/L (ref 10–35)
Albumin: 4.4 g/dL (ref 3.6–5.1)
Alkaline phosphatase (APISO): 51 U/L (ref 35–144)
BUN/Creatinine Ratio: 16 (calc) (ref 6–22)
BUN: 23 mg/dL (ref 7–25)
CO2: 27 mmol/L (ref 20–32)
Calcium: 9.4 mg/dL (ref 8.6–10.3)
Chloride: 99 mmol/L (ref 98–110)
Creat: 1.4 mg/dL — ABNORMAL HIGH (ref 0.70–1.18)
GFR, Est African American: 57 mL/min/{1.73_m2} — ABNORMAL LOW (ref >=60)
GFR, Est Non African American: 49 mL/min/{1.73_m2} — ABNORMAL LOW (ref >=60)
Globulin: 2.1 g/dL (calc) (ref 1.9–3.7)
Glucose, Bld: 146 mg/dL — ABNORMAL HIGH (ref 65–99)
Potassium: 4.3 mmol/L (ref 3.5–5.3)
Sodium: 136 mmol/L (ref 135–146)
Total Bilirubin: 0.6 mg/dL (ref 0.2–1.2)
Total Protein: 6.5 g/dL (ref 6.1–8.1)

## 2020-04-01 LAB — HEMOGLOBIN A1C
Hgb A1c MFr Bld: 6.9 % of total Hgb — ABNORMAL HIGH (ref <5.7)
Mean Plasma Glucose: 151 (calc)
eAG (mmol/L): 8.4 (calc)

## 2020-04-01 LAB — HEPATITIS C ANTIBODY
Hepatitis C Ab: NONREACTIVE
SIGNAL TO CUT-OFF: 0.01 (ref <1.00)

## 2020-04-01 LAB — TSH: TSH: 3.02 mIU/L (ref 0.40–4.50)

## 2020-04-02 ENCOUNTER — Ambulatory Visit (INDEPENDENT_AMBULATORY_CARE_PROVIDER_SITE_OTHER): Payer: Medicare Other | Admitting: Family Medicine

## 2020-04-02 ENCOUNTER — Encounter: Payer: Self-pay | Admitting: Family Medicine

## 2020-04-02 ENCOUNTER — Other Ambulatory Visit: Payer: Self-pay | Admitting: Family Medicine

## 2020-04-02 ENCOUNTER — Other Ambulatory Visit: Payer: Self-pay

## 2020-04-02 ENCOUNTER — Ambulatory Visit (INDEPENDENT_AMBULATORY_CARE_PROVIDER_SITE_OTHER): Payer: Medicare Other | Admitting: Urology

## 2020-04-02 VITALS — BP 130/79 | HR 71 | Temp 97.5°F | Resp 16 | Ht 70.0 in | Wt 186.6 lb

## 2020-04-02 VITALS — BP 133/80 | HR 78 | Ht 69.0 in | Wt 185.0 lb

## 2020-04-02 DIAGNOSIS — E1169 Type 2 diabetes mellitus with other specified complication: Secondary | ICD-10-CM

## 2020-04-02 DIAGNOSIS — E785 Hyperlipidemia, unspecified: Secondary | ICD-10-CM

## 2020-04-02 DIAGNOSIS — N323 Diverticulum of bladder: Secondary | ICD-10-CM

## 2020-04-02 DIAGNOSIS — E782 Mixed hyperlipidemia: Secondary | ICD-10-CM | POA: Insufficient documentation

## 2020-04-02 DIAGNOSIS — N1831 Chronic kidney disease, stage 3a: Secondary | ICD-10-CM

## 2020-04-02 DIAGNOSIS — I129 Hypertensive chronic kidney disease with stage 1 through stage 4 chronic kidney disease, or unspecified chronic kidney disease: Secondary | ICD-10-CM

## 2020-04-02 DIAGNOSIS — R011 Cardiac murmur, unspecified: Secondary | ICD-10-CM

## 2020-04-02 DIAGNOSIS — N183 Chronic kidney disease, stage 3 unspecified: Secondary | ICD-10-CM

## 2020-04-02 DIAGNOSIS — N400 Enlarged prostate without lower urinary tract symptoms: Secondary | ICD-10-CM | POA: Diagnosis not present

## 2020-04-02 DIAGNOSIS — N401 Enlarged prostate with lower urinary tract symptoms: Secondary | ICD-10-CM

## 2020-04-02 DIAGNOSIS — E1136 Type 2 diabetes mellitus with diabetic cataract: Secondary | ICD-10-CM | POA: Diagnosis not present

## 2020-04-02 DIAGNOSIS — R972 Elevated prostate specific antigen [PSA]: Secondary | ICD-10-CM

## 2020-04-02 DIAGNOSIS — R350 Frequency of micturition: Secondary | ICD-10-CM

## 2020-04-02 MED ORDER — ROSUVASTATIN CALCIUM 10 MG PO TABS: 10.0000 mg | ORAL_TABLET | Freq: Every day | ORAL | 3 refills | Status: DC

## 2020-04-02 NOTE — Assessment & Plan Note (Signed)
Well-controlled HTN Complication with CKD IIIa - Followed by Dr Holley Raring Last urine microalbumin was 0 (12/2019), he may start ACEi lisinopril per Nephrology   Plan:  1. No medications - is on SGLT2 currently 2. Encourage improved lifestyle - low sodium diet, regular exercise 3. Monitor BP outside office, bring readings to next visit, if persistently >140/90 or new symptoms notify office sooner  He is off ACEi/ARB - would ask Nephrology if can reconsider therapy if indicated

## 2020-04-02 NOTE — Assessment & Plan Note (Signed)
Well controlled T2DM A1c 6.9 currently Complications - CKDIII, GERD, cataracts  Plan:  1. Continue current therapy - Trijardy 25-5-1000mg  daily 2. Encourage improved lifestyle - low carb, low sugar diet, reduce portion size, continue improving regular exercise 3. Check CBG , bring log to next visit for review 4. Awaiting nephrology, ask about ACEi/ARB - - Urine microalbumin 0 (12/2019) - START STATIN therapy 5. UTD DM Foot exam today / completed [facility] [place] 12/2019

## 2020-04-02 NOTE — Progress Notes (Signed)
Subjective:    Patient ID: Jorge Mayer, male    DOB: Nov 22, 1945, 74 y.o.   MRN: [id number]  Jorge Mayer is a 74 y.o. male presenting on 04/02/2020 for medicare check up and Hypertension   HPI   BPH with LUTS Elevated PSA Recent update PSA down from 9.9 down to 2.2 (03/2020) thought to be infection as cause that raised it he says, has apt with Dr [facility] today. He also will ask about BPH and herbal saw palmetto or other therapy. No significant BPH symptoms - only mild.  HYPERLIPIDEMIA: - Reports no concerns. Last lipid panel 03/2020, mild elevated LDL 135, he admits more fried foods - He decline statin, had previously been on it. History of CVA  CHRONIC DM, Type 2: He is doing well Last A1c 6.9 (03/2020), previous 6.8 CBGs:highest readings are in PM up to < 200 he uses a Dexcon 6 constant glucose monitor Meds:Trijardy25-5-1000mg  daily (Jardiance 25 / Tradjenta 5 / metformin 1000 - combo pill) Reports good compliance. Tolerating well w/o side-effects Not on ACE ARB due to CKD per Renal - considering ACEi lisinopril Urine microalbumin due today He has history of cataracts History of CVA Denies hypoglycemia, polyuria, visual changes, numbness or tingling.  CHRONIC HTNwith CKD-IIIa Last lab result 03/2020 showed Creatinine stable at 1.40 (last checked 12/2019) He was referred to Nephrology to Dr Holley Raring ([place]), established already, had negative work up [place], renal ultrasound, they were considering ACEi in near future. Current Meds -Not on any med Denies CP, dyspnea, HA, edema, dizziness / lightheadedness   Health Maintenance:  UTD COVID vaccine  Routine Hep C negative   Depression screen [facility] 2/9 04/02/2020 11/30/2019  Decreased Interest 0 0  Down, Depressed, Hopeless 0 0  PHQ - 2 Score 0 0    Past Medical History:  Diagnosis Date  . Chronic kidney disease   . GERD (gastroesophageal reflux disease)   . Hypertension   . Prostate disease     . Stroke ([place])   . Urinary incontinence    Past Surgical History:  Procedure Laterality Date  . SHOULDER SURGERY     Right side   Social History   Socioeconomic History  . Marital status: Married    Spouse name: Not on file  . Number of children: Not on file  . Years of education: Not on file  . Highest education level: Not on file  Occupational History  . Not on file  Tobacco Use  . Smoking status: Former Smoker    Packs/day: 1.00    Years: 10.00    Pack years: 10.00    Types: Cigarettes    Quit date: 07/06/1974    Years since quitting: 45.7  . Smokeless tobacco: Never Used  Substance and Sexual Activity  . Alcohol use: Yes    Alcohol/week: 1.0 standard drink    Types: 1 Standard drinks or equivalent per week  . Drug use: Never  . Sexual activity: Not on file  Other Topics Concern  . Not on file  Social History Narrative  . Not on file   Social Determinants of Health   Financial Resource Strain:   . Difficulty of Paying Living Expenses: Not on file  Food Insecurity:   . Worried About Charity fundraiser in the Last Year: Not on file  . Ran Out of Food in the Last Year: Not on file  Transportation Needs:   . Lack of Transportation (Medical): Not on file  . Lack of  Transportation (Non-Medical): Not on file  Physical Activity:   . Days of Exercise per Week: Not on file  . Minutes of Exercise per Session: Not on file  Stress:   . Feeling of Stress : Not on file  Social Connections:   . Frequency of Communication with Friends and Family: Not on file  . Frequency of Social Gatherings with Friends and Family: Not on file  . Attends Religious Services: Not on file  . Active Member of Clubs or Organizations: Not on file  . Attends Archivist Meetings: Not on file  . Marital Status: Not on file  Intimate Partner Violence:   . Fear of Current or Ex-Partner: Not on file  . Emotionally Abused: Not on file  . Physically Abused: Not on file  . Sexually  Abused: Not on file   Family History  Problem Relation Age of Onset  . Heart disease Mother   . Heart disease Father   . Lung cancer Brother 16   Current Outpatient Medications on File Prior to Visit  Medication Sig  . esomeprazole (NEXIUM) 20 MG capsule Take 1 capsule (20 mg total) by mouth daily before breakfast.  . Specialty Vitamins Products (MAGNESIUM, AMINO ACID CHELATE,) 133 MG tablet Take 1 tablet by mouth daily. As per pt takes 400 mg  once daily.  . TRIJARDY XR 25-11-998 MG TB24 Take 1 tablet by mouth daily.   No current facility-administered medications on file prior to visit.    Review of Systems  Constitutional: Negative for activity change, appetite change, chills, diaphoresis, fatigue and fever.  HENT: Negative for congestion and hearing loss.   Eyes: Negative for visual disturbance.  Respiratory: Negative for apnea, cough, chest tightness, shortness of breath and wheezing.   Cardiovascular: Negative for chest pain, palpitations and leg swelling.  Gastrointestinal: Negative for abdominal pain, anal bleeding, blood in stool, constipation, diarrhea, nausea and vomiting.  Endocrine: Negative for cold intolerance.  Genitourinary: Negative for difficulty urinating, dysuria, frequency and hematuria.  Musculoskeletal: Negative for arthralgias, back pain and neck pain.  Skin: Negative for rash.  Allergic/Immunologic: Negative for environmental allergies.  Neurological: Negative for dizziness, weakness, light-headedness, numbness and headaches.  Hematological: Negative for adenopathy.  Psychiatric/Behavioral: Negative for behavioral problems, dysphoric mood and sleep disturbance. The patient is not nervous/anxious.    Per HPI unless specifically indicated above      Objective:    BP 130/79   Pulse 71   Temp (!) 97.5 F (36.4 C) (Temporal)   Resp 16   Ht 5\' 10"  (1.778 m)   Wt 186 lb 9.6 oz (84.6 kg)   SpO2 97%   BMI 26.77 kg/m   Wt Readings from Last 3  Encounters:  04/02/20 185 lb (83.9 kg)  04/02/20 186 lb 9.6 oz (84.6 kg)  01/26/20 186 lb (84.4 kg)    Physical Exam Vitals and nursing note reviewed.  Constitutional:      General: He is not in acute distress.    Appearance: He is well-developed. He is not diaphoretic.     Comments: Well-appearing, comfortable, cooperative  HENT:     Head: Normocephalic and atraumatic.  Eyes:     General:        Right eye: No discharge.        Left eye: No discharge.     Conjunctiva/sclera: Conjunctivae normal.     Pupils: Pupils are equal, round, and reactive to light.  Neck:     Thyroid: No thyromegaly.  Vascular: No carotid bruit.  Cardiovascular:     Rate and Rhythm: Normal rate and regular rhythm.     Heart sounds: Murmur (2/6 systolic) heard.   Pulmonary:     Effort: Pulmonary effort is normal. No respiratory distress.     Breath sounds: Normal breath sounds. No wheezing or rales.  Abdominal:     General: Bowel sounds are normal. There is no distension.     Palpations: Abdomen is soft. There is no mass.     Tenderness: There is no abdominal tenderness.  Musculoskeletal:        General: No tenderness. Normal range of motion.     Cervical back: Normal range of motion and neck supple.     Right lower leg: No edema.     Left lower leg: No edema.     Comments: Upper / Lower Extremities: - Normal muscle tone, strength bilateral upper extremities 5/5, lower extremities 5/5  Lymphadenopathy:     Cervical: No cervical adenopathy.  Skin:    General: Skin is warm and dry.     Findings: No erythema or rash.  Neurological:     Mental Status: He is alert and oriented to person, place, and time.     Comments: Distal sensation intact to light touch all extremities  Psychiatric:        Behavior: Behavior normal.     Comments: Well groomed, good eye contact, normal speech and thoughts    Results for orders placed or performed in visit on 03/29/20  PSA  Result Value Ref Range   Prostate  Specific Ag, Serum 2.2 0.0 - 4.0 ng/mL      Assessment & Plan:   Problem List Items Addressed This Visit    Type 2 diabetes mellitus with other specified complication ([place])    Well controlled T2DM A1c 6.9 currently Complications - CKDIII, GERD, cataracts  Plan:  1. Continue current therapy - Trijardy 25-5-1000mg  daily 2. Encourage improved lifestyle - low carb, low sugar diet, reduce portion size, continue improving regular exercise 3. Check CBG , bring log to next visit for review 4. Awaiting nephrology, ask about ACEi/ARB - - Urine microalbumin 0 (12/2019) - START STATIN therapy 5. UTD DM Foot exam today / completed DM [facility] [place] 12/2019      Relevant Medications   rosuvastatin (CRESTOR) 10 MG tablet   Stage 3a chronic kidney disease   Hyperlipidemia associated with type 2 diabetes mellitus (HCC)    Mild elevated LDL >130 Last lipid panel 03/2020 The 10-year ASCVD risk score Mikey Bussing DC Jr., et al., 2013) is: 40.3%  Prior CVA  Plan: 1. RESTART Statin therapy - Rosuvastatin 10mg  nightly, counseling given, see after visit summary 2. Remains off ASA 3. Encourage improved lifestyle - low carb/cholesterol, reduce portion size, continue improving regular exercise      Relevant Medications   rosuvastatin (CRESTOR) 10 MG tablet   Heart murmur, systolic    Newly identified today, previous report of prior murmur years ago Asymptomatic. Future work up as needed.      Diabetic cataract of both eyes ([place])    Complication of DM      Relevant Medications   rosuvastatin (CRESTOR) 10 MG tablet   Benign prostatic hyperplasia with urinary frequency    BPH Improved PSA now to 2.2 F/u with Urology today Consider herbal saw palmetto or rx management      Benign hypertension with CKD (chronic kidney disease) stage III - Primary    Well-controlled HTN Complication  with CKD IIIa - Followed by Dr Holley Raring Last urine microalbumin was 0 (12/2019), he may start ACEi lisinopril per Nephrology    Plan:  1. No medications - is on SGLT2 currently 2. Encourage improved lifestyle - low sodium diet, regular exercise 3. Monitor BP outside office, bring readings to next visit, if persistently >140/90 or new symptoms notify office sooner  He is off ACEi/ARB - would ask Nephrology if can reconsider therapy if indicated      Relevant Medications   rosuvastatin (CRESTOR) 10 MG tablet      Updated Health Maintenance information - UTD COVID vaccine, Moderna - Due for High dose Flu / 1st dose pneumonia vax Prevnar13 will return for both nurse visit Reviewed recent lab results with patient Encouraged improvement to lifestyle with diet and exercise - Goal of weight loss   Meds ordered this encounter  Medications  . rosuvastatin (CRESTOR) 10 MG tablet    Sig: Take 1 tablet (10 mg total) by mouth at bedtime.    Dispense:  90 tablet    Refill:  3     Follow up plan: Return in about 6 months (around 09/30/2020) for 6 month fasting lab only then 1 week later Follow-up DM, HLD statin.  Future labs 6 month CMET, Lipid A1c  Nobie Putnam, DO [facility] 04/02/2020, 9:11 AM

## 2020-04-02 NOTE — Assessment & Plan Note (Signed)
Mild elevated LDL >130 Last lipid panel 03/2020 The 10-year ASCVD risk score Jorge Mayer DC Brooke Bonito., et al., 2013) is: 40.3%  Prior CVA  Plan: 1. RESTART Statin therapy - Rosuvastatin 10mg  nightly, counseling given, see after visit summary 2. Remains off ASA 3. Encourage improved lifestyle - low carb/cholesterol, reduce portion size, continue improving regular exercise

## 2020-04-02 NOTE — Patient Instructions (Addendum)
Thank you for coming to the office today.  Please schedule and return for a NURSE ONLY VISIT for VACCINE - Approximately around mid October or November - Need High Dose Flu Vaccine - Need Prevnar-13 (initial Pneumonia Vaccine >age 75) - then 1 year due for Pneumovax-23 - 2nd final dose  Future Shingrix 2 doses if interested 2 month apart at [place].  --------------  For diarrhea  Probably side effect of the medication.  OTC Peppermint Oil (Triple Coated Capsule) 180mg  take one 3 times daily to reduce diarrhea  Try to limit artificial sweeteners, hard candy, gum, mints, creamers, caffeine.  Increasing Fiber (supplement) gummy or powder Metamucil  Strongest treatment is Imodium OTC use once in a while if too severe diarrhea.  --------------------------------------------------------------------   You are at increased risk of future Cardiovascular complications such as Heart Attack or Stroke from an artery blockage due to abnormal cholesterol and/or risk factors. - As discussed, Statin Cholesterol pills both can both LOWER cholesterol and REDUCE this future risk of heart attack and stroke - Start Rosuvastatin (generic Crestor) 10mg  pill once at bedtime every night  If you develop mild aches or pains in muscle or joint that does NOT improve or go away after first 3-4 weeks then this may require [place] to adjust the dose. First I would recommend STOPPING the medication for a few weeks until your ache and pain symptoms completely RESOLVE. Then you can restart at a LOWER DOSE either HALF a pill at bedtime every night or LESS OFTEN such as one pill a week only and then gradually increase to every other day or max dose of 3 times a week  Lastly, sometimes we need to try other versions of this medicine to find one that works for you and does not cause side effects.  DUE for FASTING BLOOD WORK (no food or drink after midnight before the lab appointment, only water or coffee without cream/sugar on  the morning of)  SCHEDULE "Lab Only" visit in the morning at the clinic for lab draw in 6 MONTHS   - Make sure Lab Only appointment is at about 1 week before your next appointment, so that results will be available  For Lab Results, once available within 2-3 days of blood draw, you can can log in to MyChart online to view your results and a brief explanation. Also, we can discuss results at next follow-up visit.   Please schedule a Follow-up Appointment to: No follow-ups on file.  If you have any other questions or concerns, please feel free to call the office or send a message through [place]. You may also schedule an earlier appointment if necessary.  Additionally, you may be receiving a survey about your experience at our office within a few days to 1 week by e-mail or mail. We value your feedback.  Nobie Putnam, DO [place]

## 2020-04-02 NOTE — Assessment & Plan Note (Signed)
Newly identified today, previous report of prior murmur years ago Asymptomatic. Future work up as needed.

## 2020-04-02 NOTE — Assessment & Plan Note (Signed)
Complication of DM

## 2020-04-02 NOTE — Assessment & Plan Note (Signed)
BPH Improved PSA now to 2.2 F/u with Urology today Consider herbal saw palmetto or rx management

## 2020-04-03 ENCOUNTER — Ambulatory Visit: Payer: 59 | Admitting: Urology

## 2020-05-10 ENCOUNTER — Ambulatory Visit (INDEPENDENT_AMBULATORY_CARE_PROVIDER_SITE_OTHER): Payer: Medicare Other

## 2020-05-10 ENCOUNTER — Other Ambulatory Visit: Payer: Self-pay

## 2020-05-10 ENCOUNTER — Telehealth: Payer: Self-pay

## 2020-05-10 DIAGNOSIS — Z23 Encounter for immunization: Secondary | ICD-10-CM

## 2020-05-10 MED ORDER — SHINGRIX 50 MCG/0.5ML IM SUSR: INTRAMUSCULAR | 1 refills | Status: DC

## 2020-05-10 NOTE — Telephone Encounter (Signed)
Copied from [place] [phone]. Topic: General - Other >> May 10, 2020 11:32 AM Oneta Rack wrote: Reason for CRM: patient requesting a hard copy Rx or shingles orders please send to [id number] [place], [place] - [address] Phone:  [phone] Fax:  [phone]

## 2020-05-10 NOTE — Telephone Encounter (Signed)
[facility] for [facility]ing[facility].  If they need a hard copy printed order, he can notify us and I can handwrite it.  Nobie Putnam, [facility] 05/10/2020, 4:02 PM

## 2020-08-18 ENCOUNTER — Other Ambulatory Visit: Payer: Self-pay | Admitting: Family Medicine

## 2020-08-18 DIAGNOSIS — E1169 Type 2 diabetes mellitus with other specified complication: Secondary | ICD-10-CM

## 2020-08-18 NOTE — Telephone Encounter (Signed)
Requested medication (s) are due for refill today: yes  Requested medication (s) are on the active medication list:  yes  Last refill:  03/28/2020  Future visit scheduled: yes  Notes to clinic:  Medication not assigned to a protocol, review manually Requested Prescriptions  Pending Prescriptions Disp Refills   TRIJARDY XR 25-11-998 MG TB24 [Pharmacy Med Name: TRIJARDYXR1G TAB 25-5 MG] 90 tablet 1    Sig: TAKE 1 TABLET DAILY      Off-Protocol Failed - 08/18/2020 12:51 PM      Failed - Medication not assigned to a protocol, review manually.      Passed - Valid encounter within last 12 months    Recent Outpatient Visits           4 months ago Benign hypertension with CKD (chronic kidney disease) stage III [facility])   [facility], Devonne Doughty, DO   7 months ago Type 2 diabetes mellitus with other specified complication, without long-term current use of insulin [facility])   [place], DO   8 months ago Type 2 diabetes mellitus with other specified complication, without long-term current use of insulin ([place])   [facility], Devonne Doughty, DO       Future Appointments             In 1 month [facility], Devonne Doughty, DO [facility], [facility]

## 2020-09-20 ENCOUNTER — Other Ambulatory Visit: Payer: Self-pay | Admitting: *Deleted

## 2020-09-20 DIAGNOSIS — E1169 Type 2 diabetes mellitus with other specified complication: Secondary | ICD-10-CM

## 2020-09-23 ENCOUNTER — Other Ambulatory Visit: Payer: Medicare Other

## 2020-09-23 ENCOUNTER — Other Ambulatory Visit: Payer: Self-pay

## 2020-09-24 LAB — COMPLETE METABOLIC PANEL WITH GFR
AG Ratio: 1.9 (calc) (ref 1.0–2.5)
ALT: 18 U/L (ref 9–46)
AST: 20 U/L (ref 10–35)
Albumin: 4.3 g/dL (ref 3.6–5.1)
Alkaline phosphatase (APISO): 46 U/L (ref 35–144)
BUN/Creatinine Ratio: 19 (calc) (ref 6–22)
BUN: 25 mg/dL (ref 7–25)
CO2: 25 mmol/L (ref 20–32)
Calcium: 9.3 mg/dL (ref 8.6–10.3)
Chloride: 102 mmol/L (ref 98–110)
Creat: 1.31 mg/dL — ABNORMAL HIGH (ref 0.70–1.18)
GFR, Est African American: 61 mL/min/{1.73_m2} (ref >=60)
GFR, Est Non African American: 53 mL/min/{1.73_m2} — ABNORMAL LOW (ref >=60)
Globulin: 2.3 g/dL (calc) (ref 1.9–3.7)
Glucose, Bld: 172 mg/dL — ABNORMAL HIGH (ref 65–99)
Potassium: 4 mmol/L (ref 3.5–5.3)
Sodium: 141 mmol/L (ref 135–146)
Total Bilirubin: 0.5 mg/dL (ref 0.2–1.2)
Total Protein: 6.6 g/dL (ref 6.1–8.1)

## 2020-09-24 LAB — HEMOGLOBIN A1C
Hgb A1c MFr Bld: 7.2 % of total Hgb — ABNORMAL HIGH (ref <5.7)
Mean Plasma Glucose: 160 mg/dL
eAG (mmol/L): 8.9 mmol/L

## 2020-09-30 ENCOUNTER — Encounter: Payer: Self-pay | Admitting: Family Medicine

## 2020-09-30 ENCOUNTER — Other Ambulatory Visit: Payer: Self-pay | Admitting: Family Medicine

## 2020-09-30 ENCOUNTER — Other Ambulatory Visit: Payer: Self-pay

## 2020-09-30 ENCOUNTER — Ambulatory Visit (INDEPENDENT_AMBULATORY_CARE_PROVIDER_SITE_OTHER): Payer: Medicare Other | Admitting: Family Medicine

## 2020-09-30 VITALS — BP 139/78 | HR 86 | Ht 69.5 in | Wt 187.8 lb

## 2020-09-30 DIAGNOSIS — N1831 Chronic kidney disease, stage 3a: Secondary | ICD-10-CM | POA: Diagnosis not present

## 2020-09-30 DIAGNOSIS — E1169 Type 2 diabetes mellitus with other specified complication: Secondary | ICD-10-CM | POA: Diagnosis not present

## 2020-09-30 DIAGNOSIS — G720 Drug-induced myopathy: Secondary | ICD-10-CM

## 2020-09-30 DIAGNOSIS — Z0000 Encounter for general adult medical examination without abnormal findings: Secondary | ICD-10-CM

## 2020-09-30 DIAGNOSIS — Z801 Family history of malignant neoplasm of trachea, bronchus and lung: Secondary | ICD-10-CM

## 2020-09-30 DIAGNOSIS — N183 Chronic kidney disease, stage 3 unspecified: Secondary | ICD-10-CM

## 2020-09-30 DIAGNOSIS — I129 Hypertensive chronic kidney disease with stage 1 through stage 4 chronic kidney disease, or unspecified chronic kidney disease: Secondary | ICD-10-CM

## 2020-09-30 DIAGNOSIS — E785 Hyperlipidemia, unspecified: Secondary | ICD-10-CM

## 2020-09-30 DIAGNOSIS — N401 Enlarged prostate with lower urinary tract symptoms: Secondary | ICD-10-CM

## 2020-09-30 DIAGNOSIS — R350 Frequency of micturition: Secondary | ICD-10-CM

## 2020-09-30 DIAGNOSIS — Z87891 Personal history of nicotine dependence: Secondary | ICD-10-CM

## 2020-09-30 NOTE — Assessment & Plan Note (Signed)
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 Will trial lower dose

## 2020-09-30 NOTE — Assessment & Plan Note (Signed)
Well controlled T2DM A1c 7.2 currently - within range 7-8 or less Complications - CKDIII, GERD, cataracts  Plan:  1. Continue current therapy - Trijardy 25-5-1000mg  daily 2. Encourage improved lifestyle - low carb, low sugar diet, reduce portion size, continue improving regular exercise 3. Check CBG , bring log to next visit for review 4. Awaiting nephrology, ask about ACEi/ARB - - Urine microalbumin 0 (12/2019) 5. Trial LOWER dose Statin now, vs intermittent therapy since myalgia

## 2020-09-30 NOTE — Assessment & Plan Note (Signed)
Mild elevated LDL >130 Last lipid panel 03/2020 The 10-year ASCVD risk score Mikey Bussing DC Brooke Bonito., et al., 2013) is: 44%  Prior CVA  Plan: 1. RESTART Statin therapy - Rosuvastatin HALF dose at 5mg  - can do 1 x weekly then titrate up as tolerated. He should do a 1-2 week wash out period off statin first to avoid side effects. 2. Remains off ASA 3. Encourage improved lifestyle - low carb/cholesterol, reduce portion size, continue improving regular exercise

## 2020-09-30 NOTE — Assessment & Plan Note (Signed)
Stable chronic BPH PSA had normalized, now we will re-check PSA in 6 months instead of Urology for now

## 2020-09-30 NOTE — Assessment & Plan Note (Addendum)
Well-controlled HTN Complication with CKD IIIa - Followed by Dr Holley Raring Last urine microalbumin was 0 (12/2019)   Plan:  1. No medications - is on SGLT2 currently 2. Encourage improved lifestyle - low sodium diet, regular exercise 3. Monitor BP outside office, bring readings to next visit, if persistently >140/90 or new symptoms notify office sooner  He is off ACEi/ARB - would ask Nephrology if can reconsider therapy if indicated

## 2020-09-30 NOTE — Patient Instructions (Addendum)
Thank you for coming to the office today.  Ordered Low Dose CT scan stay tuned they will contact you.  Reduce Rosuvastatin to 5mg  (half of your 10mg  tab) - you can STOP all together for 1-2 weeks until symptoms resolve, then return to once weekly then up to 2 to 3 times a week.  Recent Labs    12/21/19 0857 03/29/20 0835 09/23/20 0833  HGBA1C 6.8* 6.9* 7.2*   If you want to change Dexcom check with your insurance.  We will check PSA with labs in 6 months  DUE for FASTING BLOOD WORK (no food or drink after midnight before the lab appointment, only water or coffee without cream/sugar on the morning of)  SCHEDULE "Lab Only" visit in the morning at the clinic for lab draw in 6 MONTHS   - Make sure Lab Only appointment is at about 1 week before your next appointment, so that results will be available  For Lab Results, once available within 2-3 days of blood draw, you can can log in to MyChart online to view your results and a brief explanation. Also, we can discuss results at next follow-up visit.    Please schedule a Follow-up Appointment to: Return in about 6 months (around 04/02/2021) for 6 month fasting lab only then 1 week later Follow-up Yearly Medicare Checkup.  If you have any other questions or concerns, please feel free to call the office or send a message through [place]. You may also schedule an earlier appointment if necessary.  Additionally, you may be receiving a survey about your experience at our office within a few days to 1 week by e-mail or mail. We value your feedback.  Nobie Putnam, DO [place]

## 2020-09-30 NOTE — Progress Notes (Signed)
Subjective:    Patient ID: Jorge Mayer, male    DOB: Nov 27, 1945, 75 y.o.   MRN: [id number]  Md Smola is a 75 y.o. male presenting on 09/30/2020 for Diabetes and Hypertension   HPI   BPH with LUTS Elevated PSA Recent update PSA down from 9.9 down to 2.2 (03/2020), had seen Dr [facility] No significant BPH symptoms - only mild.  HYPERLIPIDEMIA: - Reports no concerns. Last lipid panel 03/2020, mild elevated LDL 135, he admits more fried foods History of CVA Admits side effect on statin myalgia on Rosuvastatin 10mg  nightly  CHRONIC DM, Type 2: He is doing well Prior A1c 6.8 to 6.9, now last A1c 7.2 CBGs: Admits issue with the glue adhesive on his dexcom CGM, asks about other reading he idoes like using the CGM to learn about which foods spike sugar Meds:Trijardy25-5-1000mg  daily(Jardiance 25 / Tradjenta 5 / metformin 1000- combo pill) Reports good compliance. Tolerating well w/o side-effects Not on ACE ARB due to CKD per Renal- considering ACEi lisinopril Urine microalbumin due today He has history of cataracts History of CVA Denies hypoglycemia, polyuria, visual changes, numbness or tingling.  CHRONIC HTNwith CKD-IIIa Last lab result 10/22/20 showed Creatinine improved to 1.31 He was referred to Nephrology to Dr Holley Raring ([place]), established already, had negative work up [place], renal ultrasound, they were considering ACEi in near future. Current Meds -Not on any med Denies CP, dyspnea, HA, edema, dizziness / lightheadedness  Former smoker 1ppd for 10 years, quit 10-23-74 His brother died in 07/24/2020 to [place], discovered at Stage IV. He has had 2 brothers passed with Lung Cancer age 40-70s  Admits some insomnia.  Health Maintenance:  UTD COVID vaccine  Routine Hep C negative   Depression screen [facility] 2/9 04/02/2020 11/30/2019  Decreased Interest 0 0  Down, Depressed, Hopeless 0 0  PHQ - 2 Score 0 0    Past Medical History:   Diagnosis Date  . Chronic kidney disease   . GERD (gastroesophageal reflux disease)   . Hypertension   . Prostate disease   . Stroke ([place])   . Urinary incontinence    Past Surgical History:  Procedure Laterality Date  . SHOULDER SURGERY     Right side   Social History   Socioeconomic History  . Marital status: Married    Spouse name: Not on file  . Number of children: Not on file  . Years of education: Not on file  . Highest education level: Not on file  Occupational History  . Not on file  Tobacco Use  . Smoking status: Former Smoker    Packs/day: 1.00    Years: 10.00    Pack years: 10.00    Types: Cigarettes    Quit date: 07/06/1974    Years since quitting: 46.2  . Smokeless tobacco: Never Used  Substance and Sexual Activity  . Alcohol use: Yes    Alcohol/week: 1.0 standard drink    Types: 1 Standard drinks or equivalent per week  . Drug use: Never  . Sexual activity: Not on file  Other Topics Concern  . Not on file  Social History Narrative  . Not on file   Social Determinants of Health   Financial Resource Strain: Not on file  Food Insecurity: Not on file  Transportation Needs: Not on file  Physical Activity: Not on file  Stress: Not on file  Social Connections: Not on file  Intimate Partner Violence: Not on file   Family History  Problem  Relation Age of Onset  . Heart disease Mother   . Heart disease Father   . Lung cancer Brother 97   Current Outpatient Medications on File Prior to Visit  Medication Sig  . Cholecalciferol (D3-1000) 25 MCG (1000 UT) tablet Take 1,000 Units by mouth daily.  . Continuous Blood Gluc Sensor (DEXCOM G6 SENSOR) MISC SMARTSIG:1 Each Topical Every 10 Days  . Continuous Blood Gluc Transmit (DEXCOM G6 TRANSMITTER) MISC   . esomeprazole (NEXIUM) 20 MG capsule Take 1 capsule (20 mg total) by mouth daily before breakfast.  . Magnesium 400 MG TABS Take by mouth.  Marland [employer] Specialty Vitamins Products (MAGNESIUM, AMINO ACID CHELATE,)  133 MG tablet Take 1 tablet by mouth daily. As per pt takes 400 mg  once daily.  . TRIJARDY XR 25-11-998 MG TB24 TAKE 1 TABLET DAILY  . Zoster Vaccine Adjuvanted [facility]) injection Inject 0.51mL into muscle once, then repeat in 2-6 months  . rosuvastatin (CRESTOR) 10 MG tablet Take 0.5 tablets (5 mg total) by mouth at bedtime. Reduce to weekly to 3 times a week if need   No current facility-administered medications on file prior to visit.    Review of Systems Per HPI unless specifically indicated above     Objective:    BP 139/78   Pulse 86   Ht 5' 9.5" (1.765 m)   Wt 187 lb 12.8 oz (85.2 kg)   SpO2 98%   BMI 27.34 kg/m   Wt Readings from Last 3 Encounters:  09/30/20 187 lb 12.8 oz (85.2 kg)  04/02/20 185 lb (83.9 kg)  04/02/20 186 lb 9.6 oz (84.6 kg)    Physical Exam Vitals and nursing note reviewed.  Constitutional:      General: He is not in acute distress.    Appearance: He is well-developed. He is not diaphoretic.     Comments: Well-appearing, comfortable, cooperative  HENT:     Head: Normocephalic and atraumatic.  Eyes:     General:        Right eye: No discharge.        Left eye: No discharge.     Conjunctiva/sclera: Conjunctivae normal.  Neck:     Thyroid: No thyromegaly.  Cardiovascular:     Rate and Rhythm: Normal rate and regular rhythm.     Heart sounds: Normal heart sounds. No murmur heard.   Pulmonary:     Effort: Pulmonary effort is normal. No respiratory distress.     Breath sounds: Normal breath sounds. No wheezing or rales.  Musculoskeletal:        General: Normal range of motion.     Cervical back: Normal range of motion and neck supple.  Lymphadenopathy:     Cervical: No cervical adenopathy.  Skin:    General: Skin is warm and dry.     Findings: No erythema or rash.  Neurological:     Mental Status: He is alert and oriented to person, place, and time.  Psychiatric:        Behavior: Behavior normal.     Comments: Well groomed, good eye  contact, normal speech and thoughts        Results for orders placed or performed in visit on 09/20/20  COMPLETE METABOLIC PANEL WITH GFR  Result Value Ref Range   Glucose, Bld 172 (H) 65 - 99 mg/dL   BUN 25 7 - 25 mg/dL   Creat 1.31 (H) 0.70 - 1.18 mg/dL   GFR, Est Non African American 53 (L) > OR =  60 mL/min/1.68m2   GFR, Est African American 61 > OR = 60 mL/min/1.31m2   BUN/Creatinine Ratio 19 6 - 22 (calc)   Sodium 141 135 - 146 mmol/L   Potassium 4.0 3.5 - 5.3 mmol/L   Chloride 102 98 - 110 mmol/L   CO2 25 20 - 32 mmol/L   Calcium 9.3 8.6 - 10.3 mg/dL   Total Protein 6.6 6.1 - 8.1 g/dL   Albumin 4.3 3.6 - 5.1 g/dL   Globulin 2.3 1.9 - 3.7 g/dL (calc)   AG Ratio 1.9 1.0 - 2.5 (calc)   Total Bilirubin 0.5 0.2 - 1.2 mg/dL   Alkaline phosphatase (APISO) 46 35 - 144 U/L   AST 20 10 - 35 U/L   ALT 18 9 - 46 U/L  Hemoglobin A1c  Result Value Ref Range   Hgb A1c MFr Bld 7.2 (H) <5.7 % of total Hgb   Mean Plasma Glucose 160 mg/dL   eAG (mmol/L) 8.9 mmol/L      Assessment & Plan:   Problem List Items Addressed This Visit    Type 2 diabetes mellitus with other specified complication (HCC)    Well controlled T2DM A1c 7.2 currently - within range 7-8 or less Complications - CKDIII, GERD, cataracts  Plan:  1. Continue current therapy - Trijardy 25-5-1000mg  daily 2. Encourage improved lifestyle - low carb, low sugar diet, reduce portion size, continue improving regular exercise 3. Check CBG , bring log to next visit for review 4. Awaiting nephrology, ask about ACEi/ARB - - Urine microalbumin 0 (12/2019) 5. Trial LOWER dose Statin now, vs intermittent therapy since myalgia      Relevant Medications   rosuvastatin (CRESTOR) 10 MG tablet   Stage 3a chronic kidney disease ([place])    See A&P HTN Followed by Nephrology      Hyperlipidemia associated with type 2 diabetes mellitus ([place]) - Primary    Mild elevated LDL >130 Last lipid panel 03/2020 The 10-year ASCVD risk score  Jorge Bussing DC Jr., et al., 2013) is: 44%  Prior CVA  Plan: 1. RESTART Statin therapy - Rosuvastatin HALF dose at 5mg  - can do 1 x weekly then titrate up as tolerated. He should do a 1-2 week wash out period off statin first to avoid side effects. 2. Remains off ASA 3. Encourage improved lifestyle - low carb/cholesterol, reduce portion size, continue improving regular exercise      Relevant Medications   rosuvastatin (CRESTOR) 10 MG tablet   Drug-induced myopathy    Myopathy secondary to statin. Will trial lower dose      Benign prostatic hyperplasia with urinary frequency    Stable chronic BPH PSA had normalized, now we will re-check PSA in 6 months instead of Urology for now      Benign hypertension with CKD (chronic kidney disease) stage III ([place])    Well-controlled HTN Complication with CKD IIIa - Followed by Dr Holley Raring Last urine microalbumin was 0 (12/2019)   Plan:  1. No medications - is on SGLT2 currently 2. Encourage improved lifestyle - low sodium diet, regular exercise 3. Monitor BP outside office, bring readings to next visit, if persistently >140/90 or new symptoms notify office sooner  He is off ACEi/ARB - would ask Nephrology if can reconsider therapy if indicated      Relevant Medications   rosuvastatin (CRESTOR) 10 MG tablet    Other Visit Diagnoses    Family history of lung cancer       Relevant Orders   CT  CHEST LUNG CA SCREEN LOW DOSE W/O CM   Former smoker       Relevant Orders   CT CHEST LUNG CA SCREEN LOW DOSE W/O CM      Fam history of lung cancer. I collaborated with Jorge Estelle RN today on [employer], and if the LDCT is not covered by insurance, we can still proceed. LDCT ordered, may not be covered as screening. Since he is a former smoker, less than required amount. - Also offer CXR if CT is too costly.  No orders of the defined types were placed in this encounter.     Follow up plan: Return in about 6 months (around  04/02/2021) for 6 month fasting lab only then 1 week later Follow-up Yearly Medicare Checkup.  Jorge Mayer, [facility] 09/30/2020, 10:55 AM

## 2020-09-30 NOTE — Assessment & Plan Note (Signed)
See A&P HTN Followed by Nephrology

## 2020-10-26 ENCOUNTER — Other Ambulatory Visit: Payer: Self-pay | Admitting: Family Medicine

## 2020-10-26 DIAGNOSIS — K219 Gastro-esophageal reflux disease without esophagitis: Secondary | ICD-10-CM

## 2020-10-26 NOTE — Telephone Encounter (Signed)
Requested Prescriptions  Pending Prescriptions Disp Refills  . esomeprazole (NEXIUM) 20 MG capsule [Pharmacy Med Name: ESOMEPRA MAG CAP 20MG  DR] 90 capsule 1    Sig: TAKE [place]     Gastroenterology: Proton Pump Inhibitors Passed - 10/26/2020  3:12 PM      Passed - Valid encounter within last 12 months    Recent Outpatient Visits          3 weeks ago Hyperlipidemia associated with type 2 diabetes mellitus [facility])   [facility], Devonne Doughty, DO   6 months ago Benign hypertension with CKD (chronic kidney disease) stage III [facility])   [facility], Devonne Doughty, DO   10 months ago Type 2 diabetes mellitus with other specified complication, without long-term current use of insulin [facility])   [facility], Devonne Doughty, DO   11 months ago Type 2 diabetes mellitus with other specified complication, without long-term current use of insulin ([place])   [facility], Devonne Doughty, DO      Future Appointments            In 5 months [facility], Devonne Doughty, DO [facility], [facility]

## 2020-12-04 NOTE — Progress Notes (Signed)
12/05/2020 11:56 AM   Jorge Mayer Aug 11, 1945 [id number]  Referring provider: Olin Hauser, DO [address] [place],  [place] [address]  Chief Complaint  Patient presents with  . Dysuria   Urological history: 1. 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 BPH with LU TS  HPI: Jorge Mayer is a 75 y.o. male who presents today for cloudy urine, urgency, frequency and dysuria.  He has been experiencing cloudy urine, frequency, urgency and urge incontinence.  A weak urinary stream which is baseline.    Patient denies any modifying or aggravating factors.  Patient denies any gross hematuria, dysuria or suprapubic/flank pain.  Patient denies any fevers, chills, nausea or vomiting.   UA 3+ glucose and  > 30 WBC's.     PMH: Past Medical History:  Diagnosis Date  . Chronic kidney disease   . GERD (gastroesophageal reflux disease)   . Hypertension   . Prostate disease   . Stroke ([place])   . Urinary incontinence     Surgical History: Past Surgical History:  Procedure Laterality Date  . SHOULDER SURGERY     Right side    Home Medications:  Allergies as of 12/05/2020   No Known Allergies     Medication List       Accurate as of December 05, 2020 11:56 AM. If you have any questions, ask your nurse or doctor.        Crestor 10 MG tablet Generic drug: rosuvastatin Take 0.5 tablets (5 mg total) by mouth at bedtime. Reduce to weekly to 3 times a week if need   D3-1000 25 MCG (1000 UT) tablet Generic drug: Cholecalciferol Take 1,000 Units by mouth daily.   Dexcom G6 Sensor Misc SMARTSIG:1 Each Topical Every 10 Days   Dexcom G6 Transmitter Misc    esomeprazole 20 MG capsule Commonly known as: NEXIUM TAKE 1 CAPSULE DAILY BEFOREBREAKFAST   magnesium (amino acid chelate) 133 MG tablet Take 1 tablet by mouth daily. As per pt takes 400 mg  once daily.   Magnesium 400 MG Tabs Take by mouth.   Shingrix injection Generic drug: Zoster Vaccine Adjuvanted Inject 0.58mL into muscle once, then repeat in 2-6 months   sulfamethoxazole-trimethoprim 800-160 MG tablet Commonly known as: BACTRIM DS Take 1 tablet by mouth every 12 (twelve) hours. Started by: Zara Council, PA-C   Trijardy XR 25-11-998 MG Tb24 Generic drug: Empagliflozin-Linaglip-Metform TAKE 1 TABLET DAILY       Allergies: No Known Allergies  Family History: Family History  Problem Relation Age of Onset  . Heart disease Mother   . Heart disease Father   . Lung cancer Brother 55    Social History:  reports that he quit smoking about 46 years ago. His smoking use included cigarettes. He has a 10.00 pack-year smoking history. He has never used smokeless tobacco. He reports current alcohol use of about 1.0 standard drink of alcohol per week. He reports that he does not use drugs.  ROS: Pertinent ROS in HPI  Physical Exam: BP 139/83   Pulse 93   Ht 5'  9" (1.753 m)   Wt 180 lb (81.6 kg)   BMI 26.58 kg/m   Constitutional:  Well nourished. Alert and oriented, No acute distress. HEENT: [place] AT, mask in place.  Trachea midline Cardiovascular: No clubbing, cyanosis, or edema. Respiratory: Normal respiratory effort, no increased work of breathing. Neurologic: Grossly intact, no focal deficits, moving all 4 extremities. Psychiatric: Normal mood and affect.  Laboratory Data: Lab Results  Component Value Date   WBC 8.7 03/29/2020   HGB 16.0 03/29/2020   HCT 48.8 03/29/2020   MCV 88.4 03/29/2020   PLT 207 03/29/2020    Lab Results  Component Value Date   CREATININE 1.31 (H) 09/23/2020    Lab Results  Component Value Date   PSA 23.5 (H) 12/21/2019    Lab  Results  Component Value Date   HGBA1C 7.2 (H) 09/23/2020    Lab Results  Component Value Date   TSH 3.02 03/29/2020       Component Value Date/Time   CHOL 226 (H) 03/29/2020 0835   HDL 73 03/29/2020 0835   CHOLHDL 3.1 03/29/2020 0835   LDLCALC 135 (H) 03/29/2020 0835    Lab Results  Component Value Date   AST 20 09/23/2020   Lab Results  Component Value Date   ALT 18 09/23/2020    Urinalysis Component     Latest Ref Rng & Units 12/05/2020  Specific Gravity, UA     1.005 - 1.030 1.015  pH, UA     5.0 - 7.5 5.5  Color, UA     Yellow Yellow  Appearance Ur     Clear Cloudy (A)  Leukocytes,UA     Negative 1+ (A)  Protein,UA     Negative/Trace Negative  Glucose, UA     Negative 3+ (A)  Ketones, UA     Negative Negative  RBC, UA     Negative Trace (A)  Bilirubin, UA     Negative Negative  Urobilinogen, Ur     0.2 - 1.0 mg/dL 0.2  Nitrite, UA     Negative Negative  Microscopic Examination      See below:   Component     Latest Ref Rng & Units 12/05/2020  WBC, UA     0 - 5 /hpf >30 (A)  RBC     0 - 2 /hpf 0-2  Epithelial Cells (non renal)     0 - 10 /hpf 0-10  Bacteria, UA     None seen/Few None seen  I have reviewed the labs.   Pertinent Imaging: No recent imaging  Assessment & Plan:    1. Dysuria -UA with pyuria -Urine sent for culture -Patient started on Septra DS twice daily as he is going out of town next week we will adjust if necessary once urine culture and sensitivities are available  Return for Pending urine culture results.  These notes generated with voice recognition software. I apologize for typographical errors.  Zara Council, PA-C  [facility] [address]  [place], [place] [address] [phone]

## 2020-12-05 ENCOUNTER — Other Ambulatory Visit: Payer: Self-pay

## 2020-12-05 ENCOUNTER — Encounter: Payer: Self-pay | Admitting: Urology

## 2020-12-05 ENCOUNTER — Ambulatory Visit (INDEPENDENT_AMBULATORY_CARE_PROVIDER_SITE_OTHER): Payer: Medicare Other | Admitting: Urology

## 2020-12-05 VITALS — BP 139/83 | HR 93 | Ht 69.0 in | Wt 180.0 lb

## 2020-12-05 DIAGNOSIS — R300 Dysuria: Secondary | ICD-10-CM | POA: Diagnosis not present

## 2020-12-05 MED ORDER — SULFAMETHOXAZOLE-TRIMETHOPRIM 800-160 MG PO TABS: 1.0000 | ORAL_TABLET | Freq: Two times a day (BID) | ORAL | 0 refills | Status: DC

## 2020-12-06 LAB — URINALYSIS, COMPLETE
Bilirubin, UA: NEGATIVE
Ketones, UA: NEGATIVE
Nitrite, UA: NEGATIVE
Protein,UA: NEGATIVE
Specific Gravity, UA: 1.015 (ref 1.005–1.030)
Urobilinogen, Ur: 0.2 mg/dL (ref 0.2–1.0)
pH, UA: 5.5 (ref 5.0–7.5)

## 2020-12-06 LAB — MICROSCOPIC EXAMINATION
Bacteria, UA: NONE SEEN
WBC, UA: >30 /hpf — AB (ref 0–5)

## 2020-12-07 ENCOUNTER — Other Ambulatory Visit: Payer: Self-pay | Admitting: Family Medicine

## 2020-12-07 DIAGNOSIS — E1169 Type 2 diabetes mellitus with other specified complication: Secondary | ICD-10-CM

## 2020-12-07 NOTE — Telephone Encounter (Signed)
Requested medication (s) are due for refill today: no  Requested medication (s) are on the active medication list: yes  Last refill:  08/19/20  Future visit scheduled: yes  Notes to clinic:  med not assigned to a protocol   Requested Prescriptions  Pending Prescriptions Disp Refills   TRIJARDY XR 25-11-998 MG TB24 [Pharmacy Med Name: TRIJARDYXR1G TAB 25-5 MG] 90 tablet 1    Sig: TAKE 1 TABLET DAILY      Off-Protocol Failed - 12/07/2020  2:48 PM      Failed - Medication not assigned to a protocol, review manually.      Passed - Valid encounter within last 12 months    Recent Outpatient Visits           2 months ago Hyperlipidemia associated with type 2 diabetes mellitus [facility])   [facility], Devonne Doughty, DO   8 months ago Benign hypertension with CKD (chronic kidney disease) stage III [facility])   [facility], Devonne Doughty, DO   11 months ago Type 2 diabetes mellitus with other specified complication, without long-term current use of insulin [facility])   [facility] Olin Hauser, DO   1 year ago Type 2 diabetes mellitus with other specified complication, without long-term current use of insulin ([place])   [facility], Devonne Doughty, DO       Future Appointments             In 4 months [facility], Devonne Doughty, DO [facility], [facility]

## 2020-12-12 LAB — CULTURE, URINE COMPREHENSIVE

## 2020-12-26 ENCOUNTER — Telehealth: Payer: Self-pay | Admitting: Family Medicine

## 2020-12-26 DIAGNOSIS — Z801 Family history of malignant neoplasm of trachea, bronchus and lung: Secondary | ICD-10-CM

## 2020-12-26 DIAGNOSIS — E1169 Type 2 diabetes mellitus with other specified complication: Secondary | ICD-10-CM

## 2020-12-26 DIAGNOSIS — Z87891 Personal history of nicotine dependence: Secondary | ICD-10-CM

## 2020-12-26 DIAGNOSIS — R059 Cough, unspecified: Secondary | ICD-10-CM

## 2020-12-26 LAB — HM DIABETES EYE EXAM

## 2020-12-26 MED ORDER — FREESTYLE LIBRE 2 SENSOR MISC: 5 refills | Status: DC

## 2020-12-26 MED ORDER — FREESTYLE LIBRE 2 READER DEVI: 3 refills | Status: DC

## 2020-12-26 NOTE — Telephone Encounter (Signed)
Called patient to follow up  Prior CT LDCT not covered due to insurance req, former smoker only.  Will order regular CT Non Contrast Chest for evaluation, history of cough, former smoker, fam history lung cancer.  Also he has allergy reaction to Dexcom now needs new order will switch to [place] 12/26/2020, 12:26 PM

## 2021-01-06 NOTE — Progress Notes (Signed)
01/07/2021 1:31 PM   Jorge Mayer [birth year] [id number]  Referring provider: Olin Hauser, DO [address] [place],  [place] [address]  Chief Complaint  Patient presents with   Dysuria    Urological history: 1. 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 BPH with LU TS  HPI: Jorge Mayer is a 75 y.o. male who presents today for follow up.  He was seen one month ago for cloudy urine, frequency, urgency and urge incontinence.  Urine culture was positive for Staphylococcus saprophyticus.  Today, he states he feels fine.  His urinary symptoms are back to baseline.  Patient denies any modifying or aggravating factors.  Patient denies any gross hematuria, dysuria or suprapubic/flank pain.  Patient denies any fevers, chills, nausea or vomiting.    UA clear.   PMH: Past Medical History:  Diagnosis Date   Chronic kidney disease    GERD (gastroesophageal reflux disease)    Hypertension    Prostate disease    Stroke [facility])    Urinary incontinence     Surgical History: Past Surgical History:  Procedure Laterality Date   SHOULDER SURGERY     Right side    Home Medications:  Allergies as of 01/07/2021   No Known Allergies      Medication List        Accurate as of January 07, 2021 11:59 PM. If you have any questions, ask your nurse or doctor.          D3-1000 25 MCG (1000 UT) tablet Generic drug: Cholecalciferol Take 1,000 Units by mouth daily.   esomeprazole 20 MG capsule Commonly known as: NEXIUM TAKE 1 CAPSULE DAILY BEFOREBREAKFAST   FreeStyle Libre 2 Reader Devi Use to check blood sugar   FreeStyle Libre 2 Sensor Misc Use to check blood  sugar   magnesium (amino acid chelate) 133 MG tablet Take 1 tablet by mouth daily. As per pt takes 400 mg  once daily.   Magnesium 400 MG Tabs Take by mouth.   Shingrix injection Generic drug: Zoster Vaccine Adjuvanted Inject 0.65mL into muscle once, then repeat in 2-6 months   sulfamethoxazole-trimethoprim 800-160 MG tablet Commonly known as: BACTRIM DS Take 1 tablet by mouth every 12 (twelve) hours.   Trijardy XR 25-11-998 MG Tb24 Generic drug: Empagliflozin-Linaglip-Metform TAKE 1 TABLET DAILY        Allergies: No Known Allergies  Family History: Family History  Problem Relation Age of Onset   Heart disease Mother    Heart disease Father    Lung cancer Brother 85    Social History:  reports that he quit smoking about 46 years ago. His smoking use included cigarettes. He has a 10.00 pack-year smoking history. He has never used smokeless tobacco. He reports current alcohol use of about 1.0 standard drink of alcohol per week. He reports that he does not use drugs.  ROS: Pertinent ROS in HPI  Physical Exam: BP (!) 189/84   Pulse 97   Ht 5\' 9"  (1.753 m)   Wt 181 lb (82.1 kg)   BMI 26.73 kg/m  Constitutional:  Well nourished. Alert and oriented, No acute distress. HEENT: [place] AT, mask in place  Trachea midline Cardiovascular: No clubbing, cyanosis, or edema. Respiratory: Normal respiratory effort, no increased work of breathing. Neurologic: Grossly intact, no focal deficits, moving all 4 extremities. Psychiatric: Normal mood and affect.   Laboratory Data: Urinalysis Component     Latest Ref Rng & Units 01/07/2021  Specific Gravity, UA     1.005 - 1.030 <1.005 (L)  pH, UA     5.0 - 7.5 5.0  Color, UA     Yellow Yellow  Appearance Ur     Clear Clear  Leukocytes,UA     Negative Negative  Protein,UA     Negative/Trace Negative  Glucose, UA     Negative 3+ (A)  Ketones, UA     Negative Negative  RBC, UA     Negative Negative  Bilirubin, UA     Negative  Negative  Urobilinogen, Ur     0.2 - 1.0 mg/dL 0.2  Nitrite, UA     Negative Negative   I have reviewed the labs.   Pertinent Imaging: No recent imaging  Assessment & Plan:    1. Dysuria -resolved -UA clear  Return if symptoms worsen or fail to improve.  These notes generated with voice recognition software. I apologize for typographical errors.  Zara Council, PA-C  [facility] [address]  [place], [place] [address] [phone]

## 2021-01-07 ENCOUNTER — Other Ambulatory Visit: Payer: Self-pay

## 2021-01-07 ENCOUNTER — Encounter: Payer: Self-pay | Admitting: Urology

## 2021-01-07 ENCOUNTER — Ambulatory Visit (INDEPENDENT_AMBULATORY_CARE_PROVIDER_SITE_OTHER): Payer: Medicare Other | Admitting: Urology

## 2021-01-07 VITALS — BP 189/84 | HR 97 | Ht 69.0 in | Wt 181.0 lb

## 2021-01-07 DIAGNOSIS — R300 Dysuria: Secondary | ICD-10-CM | POA: Diagnosis not present

## 2021-01-08 ENCOUNTER — Ambulatory Visit
Admission: RE | Admit: 2021-01-08 | Discharge: 2021-01-08 | Disposition: A | Discharge status: Home / Self Care | Payer: Medicare Other | Source: Ambulatory Visit | Attending: Family Medicine | Admitting: Family Medicine

## 2021-01-08 DIAGNOSIS — I2510 Atherosclerotic heart disease of native coronary artery without angina pectoris: Secondary | ICD-10-CM | POA: Diagnosis not present

## 2021-01-08 DIAGNOSIS — Z87891 Personal history of nicotine dependence: Secondary | ICD-10-CM

## 2021-01-08 DIAGNOSIS — K449 Diaphragmatic hernia without obstruction or gangrene: Secondary | ICD-10-CM | POA: Diagnosis not present

## 2021-01-08 DIAGNOSIS — I700 Atherosclerosis of aorta: Secondary | ICD-10-CM | POA: Insufficient documentation

## 2021-01-08 DIAGNOSIS — Z801 Family history of malignant neoplasm of trachea, bronchus and lung: Secondary | ICD-10-CM | POA: Diagnosis not present

## 2021-01-08 DIAGNOSIS — R918 Other nonspecific abnormal finding of lung field: Secondary | ICD-10-CM | POA: Diagnosis not present

## 2021-01-08 DIAGNOSIS — R059 Cough, unspecified: Secondary | ICD-10-CM | POA: Diagnosis not present

## 2021-01-08 LAB — URINALYSIS, COMPLETE
Bilirubin, UA: NEGATIVE
Ketones, UA: NEGATIVE
Leukocytes,UA: NEGATIVE
Nitrite, UA: NEGATIVE
Protein,UA: NEGATIVE
RBC, UA: NEGATIVE
Specific Gravity, UA: <1.005 — ABNORMAL LOW (ref 1.005–1.030)
Urobilinogen, Ur: 0.2 mg/dL (ref 0.2–1.0)
pH, UA: 5 (ref 5.0–7.5)

## 2021-01-09 ENCOUNTER — Encounter: Payer: Self-pay | Admitting: Family Medicine

## 2021-01-09 DIAGNOSIS — I700 Atherosclerosis of aorta: Secondary | ICD-10-CM | POA: Insufficient documentation

## 2021-01-09 DIAGNOSIS — R918 Other nonspecific abnormal finding of lung field: Secondary | ICD-10-CM | POA: Insufficient documentation

## 2021-03-12 ENCOUNTER — Ambulatory Visit (INDEPENDENT_AMBULATORY_CARE_PROVIDER_SITE_OTHER): Payer: Medicare Other | Admitting: Dermatology

## 2021-03-12 ENCOUNTER — Other Ambulatory Visit: Payer: Self-pay

## 2021-03-12 DIAGNOSIS — L814 Other melanin hyperpigmentation: Secondary | ICD-10-CM | POA: Diagnosis not present

## 2021-03-12 DIAGNOSIS — Z872 Personal history of diseases of the skin and subcutaneous tissue: Secondary | ICD-10-CM

## 2021-03-12 DIAGNOSIS — D225 Melanocytic nevi of trunk: Secondary | ICD-10-CM | POA: Diagnosis not present

## 2021-03-12 DIAGNOSIS — D229 Melanocytic nevi, unspecified: Secondary | ICD-10-CM

## 2021-03-12 DIAGNOSIS — D239 Other benign neoplasm of skin, unspecified: Secondary | ICD-10-CM

## 2021-03-12 DIAGNOSIS — Z1283 Encounter for screening for malignant neoplasm of skin: Secondary | ICD-10-CM | POA: Diagnosis not present

## 2021-03-12 DIAGNOSIS — L570 Actinic keratosis: Secondary | ICD-10-CM | POA: Diagnosis not present

## 2021-03-12 DIAGNOSIS — L578 Other skin changes due to chronic exposure to nonionizing radiation: Secondary | ICD-10-CM | POA: Diagnosis not present

## 2021-03-12 DIAGNOSIS — D1800 Hemangioma unspecified site: Secondary | ICD-10-CM

## 2021-03-12 DIAGNOSIS — L820 Inflamed seborrheic keratosis: Secondary | ICD-10-CM

## 2021-03-12 DIAGNOSIS — L821 Other seborrheic keratosis: Secondary | ICD-10-CM

## 2021-03-12 DIAGNOSIS — D492 Neoplasm of unspecified behavior of bone, soft tissue, and skin: Secondary | ICD-10-CM

## 2021-03-12 HISTORY — DX: Other benign neoplasm of skin, unspecified: D23.9

## 2021-03-12 NOTE — Progress Notes (Signed)
Follow-Up Visit   Subjective  Jorge Mayer is a 75 y.o. male who presents for the following: Annual Exam (Yearly mole check ). Hx of precancers. Pt c/o irritated growths on his face growing and changing.  The patient presents for Total-Body Skin Exam (TBSE) for skin cancer screening and mole check.   The following portions of the chart were reviewed this encounter and updated as appropriate:   Tobacco  Allergies  Meds  Problems  Med Hx  Surg Hx  Fam Hx     Review of Systems:  No other skin or systemic complaints except as noted in HPI or Assessment and Plan.  Objective  Well appearing patient in no apparent distress; mood and affect are within normal limits.  A full examination was performed including scalp, head, eyes, ears, nose, lips, neck, chest, axillae, abdomen, back, buttocks, bilateral upper extremities, bilateral lower extremities, hands, feet, fingers, toes, fingernails, and toenails. All findings within normal limits unless otherwise noted below.  Scalp (4) Erythematous thin papules/macules with gritty scale.   right temple x (2) (2) Erythematous keratotic or waxy stuck-on papule or plaque.   left upper quad abdomen 0.7 cm irregular brown macule    Assessment & Plan  AK (actinic keratosis) (4) Scalp  Destruction of lesion - Scalp Complexity: simple   Destruction method: cryotherapy   Informed consent: discussed and consent obtained   Timeout:  patient name, date of birth, surgical site, and procedure verified Lesion destroyed using liquid nitrogen: Yes   Region frozen until ice ball extended beyond lesion: Yes   Outcome: patient tolerated procedure well with no complications   Post-procedure details: wound care instructions given    Inflamed seborrheic keratosis right temple x (2)  Destruction of lesion - right temple x (2) Complexity: simple   Destruction method: cryotherapy   Informed consent: discussed and consent obtained   Timeout:  patient  name, date of birth, surgical site, and procedure verified Lesion destroyed using liquid nitrogen: Yes   Region frozen until ice ball extended beyond lesion: Yes   Outcome: patient tolerated procedure well with no complications   Post-procedure details: wound care instructions given    Neoplasm of skin left upper quad abdomen  Epidermal / dermal shaving  Lesion diameter (cm):  0.7 Informed consent: discussed and consent obtained   Timeout: patient name, date of birth, surgical site, and procedure verified   Procedure prep:  Patient was prepped and draped in usual sterile fashion Prep type:  Isopropyl alcohol Anesthesia: the lesion was anesthetized in a standard fashion   Anesthetic:  1% lidocaine w/ epinephrine 1-100,000 buffered w/ 8.4% NaHCO3 Hemostasis achieved with: pressure, aluminum chloride and electrodesiccation   Outcome: patient tolerated procedure well   Post-procedure details: sterile dressing applied and wound care instructions given   Dressing type: bandage and petrolatum    Specimen 1 - Surgical pathology Differential Diagnosis: R/O Dysplastic nevus vs other   Check Margins: No  Skin cancer screening  Lentigines - Scattered tan macules - Due to sun exposure - Benign-appering, observe - Recommend daily broad spectrum sunscreen SPF 30+ to sun-exposed areas, reapply every 2 hours as needed. - Call for any changes  Seborrheic Keratoses - Stuck-on, waxy, tan-brown papules and/or plaques  - Benign-appearing - Discussed benign etiology and prognosis. - Observe - Call for any changes  Melanocytic Nevi - Tan-brown and/or pink-flesh-colored symmetric macules and papules - Benign appearing on exam today - Observation - Call clinic for new or changing moles - Recommend  daily use of broad spectrum spf 30+ sunscreen to sun-exposed areas.   Hemangiomas - Red papules - Discussed benign nature - Observe - Call for any changes  Actinic Damage - Chronic  condition, secondary to cumulative UV/sun exposure - diffuse scaly erythematous macules with underlying dyspigmentation - Recommend daily broad spectrum sunscreen SPF 30+ to sun-exposed areas, reapply every 2 hours as needed.  - Staying in the shade or wearing long sleeves, sun glasses (UVA+UVB protection) and wide brim hats (4-inch brim around the entire circumference of the hat) are also recommended for sun protection.  - Call for new or changing lesions.  Skin cancer screening performed today.   Return in about 1 year (around 03/12/2022) for TBSE .  IMarye Round, CMA, am acting as scribe for Sarina Ser, MD .  Documentation: I have reviewed the above documentation for accuracy and completeness, and I agree with the above.  Sarina Ser, MD

## 2021-03-12 NOTE — Patient Instructions (Addendum)
Cryotherapy Aftercare  Wash gently with soap and water everyday.   Apply Vaseline and Band-Aid daily until healed.    Wound Care Instructions  Cleanse wound gently with soap and water once a day then pat dry with clean gauze. Apply a thing coat of Petrolatum (petroleum jelly, "Vaseline") over the wound (unless you have an allergy to this). We recommend that you use a new, sterile tube of Vaseline. Do not pick or remove scabs. Do not remove the yellow or white "healing tissue" from the base of the wound.  Cover the wound with fresh, clean, nonstick gauze and secure with paper tape. You may use Band-Aids in place of gauze and tape if the would is small enough, but would recommend trimming much of the tape off as there is often too much. Sometimes Band-Aids can irritate the skin.  You should call the office for your biopsy report after 1 week if you have not already been contacted.  If you experience any problems, such as abnormal amounts of bleeding, swelling, significant bruising, significant pain, or evidence of infection, please call the office immediately.  FOR ADULT SURGERY PATIENTS: If you need something for pain relief you may take 1 extra strength Tylenol (acetaminophen) AND 2 Ibuprofen (200mg each) together every 4 hours as needed for pain. (do not take these if you are allergic to them or if you have a reason you should not take them.) Typically, you may only need pain medication for 1 to 3 days.     If you have any questions or concerns for your doctor, please call our main line at [phone] and press option 4 to reach your doctor's medical assistant. If no one answers, please leave a voicemail as directed and we will return your call as soon as possible. Messages left after 4 pm will be answered the following business day.   You may also send us a message via MyChart. We typically respond to MyChart messages within 1-2 business days.  For prescription refills, please ask your  pharmacy to contact our office. Our fax number is [phone].  If you have an urgent issue when the clinic is closed that cannot wait until the next business day, you can page your doctor at the number below.    Please note that while we do our best to be available for urgent issues outside of office hours, we are not available 24/7.   If you have an urgent issue and are unable to reach us, you may choose to seek medical care at your doctor's office, retail clinic, urgent care center, or emergency room.  If you have a medical emergency, please immediately call 911 or go to the emergency department.  Pager Numbers  - Dr. Kowalski: [phone]  - Dr. Moye: [phone]  - Dr. Stewart: [phone]  In the event of inclement weather, please call our main line at [phone] for an update on the status of any delays or closures.  Dermatology Medication Tips: Please keep the boxes that topical medications come in in order to help keep track of the instructions about where and how to use these. Pharmacies typically print the medication instructions only on the boxes and not directly on the medication tubes.   If your medication is too expensive, please contact our office at [phone] option 4 or send us a message through MyChart.   We are unable to tell what your co-pay for medications will be in advance as this is different depending on your insurance coverage.   However, we may be able to find a substitute medication at lower cost or fill out paperwork to get insurance to cover a needed medication.   If a prior authorization is required to get your medication covered by your insurance company, please allow us 1-2 business days to complete this process.  Drug prices often vary depending on where the prescription is filled and some pharmacies may offer cheaper prices.  The website www.goodrx.com contains coupons for medications through different pharmacies. The prices here do not  account for what the cost may be with help from insurance (it may be cheaper with your insurance), but the website can give you the price if you did not use any insurance.  - You can print the associated coupon and take it with your prescription to the pharmacy.  - You may also stop by our office during regular business hours and pick up a GoodRx coupon card.  - If you need your prescription sent electronically to a different pharmacy, notify our office through [facility] MyChart or by phone at [phone] option 4.  

## 2021-03-16 ENCOUNTER — Encounter: Payer: Self-pay | Admitting: Dermatology

## 2021-03-21 ENCOUNTER — Telehealth: Payer: Self-pay

## 2021-03-21 NOTE — Telephone Encounter (Signed)
LM on VM please return my call  

## 2021-03-21 NOTE — Telephone Encounter (Signed)
-----   Message from Ralene Bathe, MD sent at 03/13/2021  5:24 PM EDT ----- Diagnosis Skin , left upper quad abdomen JUNCTIONAL DYSPLASTIC MELANOCYTIC NEVUS WITH MODERATE TO SEVERE ATYPIA, INFLAMED, CLOSE TO MARGIN, SEE DESCRIPTION  Severe dysplastic Schedule surgery

## 2021-03-24 ENCOUNTER — Telehealth: Payer: Self-pay

## 2021-03-24 NOTE — Telephone Encounter (Signed)
-----   Message from Ralene Bathe, MD sent at 03/13/2021  5:24 PM EDT ----- Diagnosis Skin , left upper quad abdomen JUNCTIONAL DYSPLASTIC MELANOCYTIC NEVUS WITH MODERATE TO SEVERE ATYPIA, INFLAMED, CLOSE TO MARGIN, SEE DESCRIPTION  Severe dysplastic Schedule surgery

## 2021-03-24 NOTE — Telephone Encounter (Signed)
Patient advised of BX results and scheduled for surgery.

## 2021-04-07 ENCOUNTER — Other Ambulatory Visit: Payer: Self-pay

## 2021-04-07 DIAGNOSIS — E785 Hyperlipidemia, unspecified: Secondary | ICD-10-CM

## 2021-04-07 DIAGNOSIS — N401 Enlarged prostate with lower urinary tract symptoms: Secondary | ICD-10-CM

## 2021-04-07 DIAGNOSIS — E1169 Type 2 diabetes mellitus with other specified complication: Secondary | ICD-10-CM

## 2021-04-07 DIAGNOSIS — I129 Hypertensive chronic kidney disease with stage 1 through stage 4 chronic kidney disease, or unspecified chronic kidney disease: Secondary | ICD-10-CM

## 2021-04-07 DIAGNOSIS — N183 Chronic kidney disease, stage 3 unspecified: Secondary | ICD-10-CM

## 2021-04-07 DIAGNOSIS — Z0000 Encounter for general adult medical examination without abnormal findings: Secondary | ICD-10-CM

## 2021-04-08 ENCOUNTER — Other Ambulatory Visit: Payer: Medicare Other

## 2021-04-08 ENCOUNTER — Other Ambulatory Visit: Payer: Self-pay

## 2021-04-09 LAB — CBC WITH DIFFERENTIAL/PLATELET
Absolute Monocytes: 681 cells/uL (ref 200–950)
Basophils Absolute: 59 cells/uL (ref 0–200)
Basophils Relative: 0.8 %
Eosinophils Absolute: 170 cells/uL (ref 15–500)
Eosinophils Relative: 2.3 %
HCT: 48.8 % (ref 38.5–50.0)
Hemoglobin: 15.8 g/dL (ref 13.2–17.1)
Lymphs Abs: 1695 cells/uL (ref 850–3900)
MCH: 29 pg (ref 27.0–33.0)
MCHC: 32.4 g/dL (ref 32.0–36.0)
MCV: 89.7 fL (ref 80.0–100.0)
MPV: 10.6 fL (ref 7.5–12.5)
Monocytes Relative: 9.2 %
Neutro Abs: 4795 cells/uL (ref 1500–7800)
Neutrophils Relative %: 64.8 %
Platelets: 213 10*3/uL (ref 140–400)
RBC: 5.44 10*6/uL (ref 4.20–5.80)
RDW: 12.9 % (ref 11.0–15.0)
Total Lymphocyte: 22.9 %
WBC: 7.4 10*3/uL (ref 3.8–10.8)

## 2021-04-09 LAB — COMPLETE METABOLIC PANEL WITH GFR
AG Ratio: 1.7 (calc) (ref 1.0–2.5)
ALT: 18 U/L (ref 9–46)
AST: 19 U/L (ref 10–35)
Albumin: 4.1 g/dL (ref 3.6–5.1)
Alkaline phosphatase (APISO): 65 U/L (ref 35–144)
BUN/Creatinine Ratio: 17 (calc) (ref 6–22)
BUN: 22 mg/dL (ref 7–25)
CO2: 27 mmol/L (ref 20–32)
Calcium: 9 mg/dL (ref 8.6–10.3)
Chloride: 101 mmol/L (ref 98–110)
Creat: 1.32 mg/dL — ABNORMAL HIGH (ref 0.70–1.28)
Globulin: 2.4 g/dL (calc) (ref 1.9–3.7)
Glucose, Bld: 171 mg/dL — ABNORMAL HIGH (ref 65–99)
Potassium: 4.3 mmol/L (ref 3.5–5.3)
Sodium: 139 mmol/L (ref 135–146)
Total Bilirubin: 0.4 mg/dL (ref 0.2–1.2)
Total Protein: 6.5 g/dL (ref 6.1–8.1)
eGFR: 56 mL/min/{1.73_m2} — ABNORMAL LOW (ref >=60)

## 2021-04-09 LAB — LIPID PANEL
Cholesterol: 150 mg/dL (ref <200)
HDL: 74 mg/dL (ref >=40)
LDL Cholesterol (Calc): 58 mg/dL (calc)
Non-HDL Cholesterol (Calc): 76 mg/dL (calc) (ref <130)
Total CHOL/HDL Ratio: 2 (calc) (ref <5.0)
Triglycerides: 98 mg/dL (ref <150)

## 2021-04-09 LAB — HEMOGLOBIN A1C
Hgb A1c MFr Bld: 7.3 % of total Hgb — ABNORMAL HIGH (ref <5.7)
Mean Plasma Glucose: 163 mg/dL
eAG (mmol/L): 9 mmol/L

## 2021-04-09 LAB — TSH: TSH: 2.62 mIU/L (ref 0.40–4.50)

## 2021-04-09 LAB — PSA: PSA: 2.31 ng/mL (ref <=4.00)

## 2021-04-15 ENCOUNTER — Other Ambulatory Visit: Payer: Self-pay

## 2021-04-15 ENCOUNTER — Encounter: Payer: Self-pay | Admitting: Family Medicine

## 2021-04-15 ENCOUNTER — Ambulatory Visit (INDEPENDENT_AMBULATORY_CARE_PROVIDER_SITE_OTHER): Payer: Medicare Other | Admitting: Family Medicine

## 2021-04-15 VITALS — BP 133/71 | HR 81 | Ht 69.0 in | Wt 189.2 lb

## 2021-04-15 DIAGNOSIS — I700 Atherosclerosis of aorta: Secondary | ICD-10-CM | POA: Diagnosis not present

## 2021-04-15 DIAGNOSIS — R911 Solitary pulmonary nodule: Secondary | ICD-10-CM

## 2021-04-15 DIAGNOSIS — N1831 Chronic kidney disease, stage 3a: Secondary | ICD-10-CM

## 2021-04-15 DIAGNOSIS — E1169 Type 2 diabetes mellitus with other specified complication: Secondary | ICD-10-CM

## 2021-04-15 DIAGNOSIS — Z23 Encounter for immunization: Secondary | ICD-10-CM

## 2021-04-15 DIAGNOSIS — Z801 Family history of malignant neoplasm of trachea, bronchus and lung: Secondary | ICD-10-CM

## 2021-04-15 DIAGNOSIS — I129 Hypertensive chronic kidney disease with stage 1 through stage 4 chronic kidney disease, or unspecified chronic kidney disease: Secondary | ICD-10-CM

## 2021-04-15 DIAGNOSIS — N401 Enlarged prostate with lower urinary tract symptoms: Secondary | ICD-10-CM

## 2021-04-15 DIAGNOSIS — R350 Frequency of micturition: Secondary | ICD-10-CM

## 2021-04-15 DIAGNOSIS — N183 Chronic kidney disease, stage 3 unspecified: Secondary | ICD-10-CM

## 2021-04-15 DIAGNOSIS — E1142 Type 2 diabetes mellitus with diabetic polyneuropathy: Secondary | ICD-10-CM

## 2021-04-15 DIAGNOSIS — E785 Hyperlipidemia, unspecified: Secondary | ICD-10-CM

## 2021-04-15 LAB — POCT UA - MICROALBUMIN: Microalbumin Ur, POC: 20 mg/L

## 2021-04-15 MED ORDER — GABAPENTIN 100 MG PO CAPS: 100.0000 mg | ORAL_CAPSULE | Freq: Every day | ORAL | 3 refills | Status: DC

## 2021-04-15 NOTE — Assessment & Plan Note (Signed)
See A&P HTN Followed by Nephrology

## 2021-04-15 NOTE — Assessment & Plan Note (Signed)
Stable, seen on imaging On Statin therapy

## 2021-04-15 NOTE — Progress Notes (Signed)
Subjective:    Patient ID: Jorge Mayer, male    DOB: 1946/04/10, 75 y.o.   MRN: [id number]  Jorge Mayer is a 75 y.o. male presenting on 04/15/2021 for Diabetes   HPI  BPH with LUTS Elevated PSA Previous  update PSA down from 9.9 down to 2.2 (03/2020) PSA normalized 2.31, prior result 23.5 with UTI 1 year ago. Had seen Urologist. had seen Dr [facility] No significant BPH symptoms - only mild.   HYPERLIPIDEMIA: - Reports no concerns. Last lipid panel 04/2021, LDL improved back on statin from 135 to 58. Has restarted statin Atorvastatin 21m daily, seems was not having myalgia on med History of CVA   CHRONIC DM, Type 2: He is doing well A1c up to 7.3, from 7.2 overall stable. CBGs: Still sees resting glucose in AM elevated. If active could be 170-180 after meal. Meds: Trijardy 25-5-1000mg daily (Jardiance 25 / Tradjenta 5 / metformin 1000 - combo pill) Reports good compliance. Tolerating well w/o side-effects Not on ACE ARB due to CKD per Renal - considering ACEi lisinopril Urine microalbumin due today He has history of cataracts History of CVA Denies hypoglycemia, polyuria, visual changes, numbness or tingling.   CHRONIC HTN with CKD-IIIa Last lab result 09/2020 showed Creatinine stable to 1.32 He was referred to Nephrology to Dr LHolley Raring([facility], established already, had negative work up SDotsero renal ultrasound, they were considering ACEi in near future. Current Meds - Not on any med   Denies CP, dyspnea, HA, edema, dizziness / lightheadedness   Former smoker 1ppd for 10 years, quit [id number] [place]. He has had 2 brothers passed with Lung Cancer age 76-70s BPH LUTS He has had some UTI in past, has LUTS Has seen Urology  Heart Murmur Similar to previous. asymptomatic   Additional topics  Pulmonary Micronodules, LLL Previous screening CT imaging 01/2021, advised to repeat 3-6 CT imaging He  has fam history lung cancer, brother x 2 one smoker one not  Atherosclerosis of Aorta Identified on CT imaging previously  Health Maintenance:   UTD on MLa RussellVaccine and boosters, he will be due for new booster omicron variant at pharmacy  Due for Flu Shot, will receive today    [facility] 04/15/2021 04/02/2020 11/30/2019  Decreased Interest 0 0 0  Down, Depressed, Hopeless 0 0 0  PHQ - 2 Score 0 0 0  Altered sleeping 0 - -  Tired, decreased energy 0 - -  Change in appetite 0 - -  Feeling bad or failure about yourself  0 - -  Trouble concentrating 0 - -  Moving slowly or fidgety/restless 0 - -  Suicidal thoughts 0 - -  PHQ-9 Score 0 - -  Difficult doing work/chores Not difficult at all - -    Past Medical History:  Diagnosis Date   Chronic kidney disease    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   left upper quad abdomen, mod to severe   GERD (gastroesophageal reflux disease)    Hypertension    Prostate disease    Stroke ([facility]    Urinary incontinence    Past Surgical History:  Procedure Laterality Date   SHOULDER SURGERY     Right side   Social History   Socioeconomic History   Marital status: Married    Spouse name: Not on file   Number of children: Not on file   Years of education: Not on file  Highest education level: Not on file  Occupational History   Not on file  Tobacco Use   Smoking status: Former    Packs/day: 1.00    Years: 10.00    Pack years: 10.00    Types: Cigarettes    Quit date: 07/06/1974    Years since quitting: 46.8   Smokeless tobacco: Never  Substance and Sexual Activity   Alcohol use: Yes    Alcohol/week: 1.0 standard drink    Types: 1 Standard drinks or equivalent per week   Drug use: Never   Sexual activity: Not on file  Other Topics Concern   Not on file  Social History Narrative   Not on file   Social Determinants of Health   Financial Resource Strain: Not on file  Food Insecurity: Not on file   Transportation Needs: Not on file  Physical Activity: Not on file  Stress: Not on file  Social Connections: Not on file  Intimate Partner Violence: Not on file   Family History  Problem Relation Age of Onset   Heart disease Mother    Heart disease Father    Lung cancer Brother 11   Current Outpatient Medications on File Prior to Visit  Medication Sig   atorvastatin (LIPITOR) 10 MG tablet Take 10 mg by mouth daily.   Cholecalciferol (D3-1000) 25 MCG (1000 UT) tablet Take 1,000 Units by mouth daily.   Continuous Blood Gluc Receiver (FREESTYLE LIBRE 2 READER) DEVI Use to check blood sugar   Continuous Blood Gluc Sensor (FREESTYLE LIBRE 2 SENSOR) MISC Use to check blood sugar   esomeprazole (NEXIUM) 20 MG capsule TAKE 1 CAPSULE DAILY BEFOREBREAKFAST   Magnesium 400 MG TABS Take by mouth.   Specialty Vitamins Products (MAGNESIUM, AMINO ACID CHELATE,) 133 MG tablet Take 1 tablet by mouth daily. As per pt takes 400 mg  once daily.   TRIJARDY XR 25-11-998 MG TB24 TAKE 1 TABLET DAILY   No current facility-administered medications on file prior to visit.    Review of Systems  Constitutional:  Negative for activity change, appetite change, chills, diaphoresis, fatigue and fever.  HENT:  Negative for congestion and hearing loss.   Eyes:  Negative for visual disturbance.  Respiratory:  Negative for cough, chest tightness, shortness of breath and wheezing.   Cardiovascular:  Negative for chest pain, palpitations and leg swelling.  Gastrointestinal:  Negative for abdominal pain, constipation, diarrhea, nausea and vomiting.  Genitourinary:  Negative for difficulty urinating, dysuria, frequency and hematuria.  Musculoskeletal:  Negative for arthralgias and neck pain.  Skin:  Negative for rash.  Neurological:  Negative for dizziness, weakness, light-headedness, numbness and headaches.  Hematological:  Negative for adenopathy.  Psychiatric/Behavioral:  Negative for behavioral problems, dysphoric  mood and sleep disturbance.   Per HPI unless specifically indicated above      Objective:    BP 133/71   Pulse 81   Ht 5' 9"  (1.753 m)   Wt 189 lb 3.2 oz (85.8 kg)   SpO2 99%   BMI 27.94 kg/m   Wt Readings from Last 3 Encounters:  04/15/21 189 lb 3.2 oz (85.8 kg)  01/07/21 181 lb (82.1 kg)  12/05/20 180 lb (81.6 kg)    Physical Exam Vitals and nursing note reviewed.  Constitutional:      General: He is not in acute distress.    Appearance: He is well-developed. He is not diaphoretic.     Comments: Well-appearing, comfortable, cooperative  HENT:     Head: Normocephalic and atraumatic.  Eyes:     General:        Right eye: No discharge.        Left eye: No discharge.     Conjunctiva/sclera: Conjunctivae normal.     Pupils: Pupils are equal, round, and reactive to light.  Neck:     Thyroid: No thyromegaly.     Vascular: No carotid bruit.  Cardiovascular:     Rate and Rhythm: Normal rate and regular rhythm.     Pulses: Normal pulses.     Heart sounds: Murmur (mild 2/6 systolic murmur stable) heard.  Pulmonary:     Effort: Pulmonary effort is normal. No respiratory distress.     Breath sounds: Normal breath sounds. No wheezing or rales.  Abdominal:     General: Bowel sounds are normal. There is no distension.     Palpations: Abdomen is soft. There is no mass.     Tenderness: There is no abdominal tenderness.  Musculoskeletal:        General: No tenderness. Normal range of motion.     Cervical back: Normal range of motion and neck supple.     Comments: Upper / Lower Extremities: - Normal muscle tone, strength bilateral upper extremities 5/5, lower extremities 5/5  Lymphadenopathy:     Cervical: No cervical adenopathy.  Skin:    General: Skin is warm and dry.     Findings: No erythema or rash.  Neurological:     Mental Status: He is alert and oriented to person, place, and time.     Comments: Distal sensation intact to light touch all extremities  Psychiatric:         Mood and Affect: Mood normal.        Behavior: Behavior normal.        Thought Content: Thought content normal.     Comments: Well groomed, good eye contact, normal speech and thoughts   Diabetic Foot Exam - Simple   Simple Foot Form Diabetic Foot exam was performed with the following findings: Yes 04/15/2021  9:36 AM  Visual Inspection See comments: Yes Sensation Testing See comments: Yes Pulse Check Posterior Tibialis and Dorsalis pulse intact bilaterally: Yes Comments Mild reduced sensation monofilament bilateral heels and plantar MTP and great toe. No ulceration. Some dry skin and callus formation.      I have personally reviewed the radiology report from CT imaging Chest 01/08/21  CLINICAL DATA:  Cough.  Former smoker.  History of lung cancer.   EXAM: CT CHEST WITHOUT CONTRAST   TECHNIQUE: Multidetector CT imaging of the chest was performed following the standard protocol without IV contrast.   COMPARISON:  None.   FINDINGS: Cardiovascular: Normal heart size. No significant pericardial effusion. The thoracic aorta is normal in caliber. Moderate atherosclerotic plaque of the thoracic aorta. At least 3 vessel coronary artery calcifications.   Mediastinum/Nodes: No gross hilar adenopathy, noting limited sensitivity for the detection of hilar adenopathy on this noncontrast study. No enlarged mediastinal or axillary lymph nodes. Thyroid gland, trachea, and esophagus demonstrate no significant findings. At least small volume hiatal hernia.   Lungs/Pleura: No focal consolidation. 4 mm pulmonary nodule within the right lower lobe (3:104). 4 mm pulmonary nodule within left lower lobe (3:94). 5 mm subsolid pulmonary nodule within the left lower lobe (3:68). Subjacent cluster of micronodules (3:63). Query vague ground-glass airspace opacities within the left lower lobe. Punctate calcified lesion within the left upper lobe (3:34). Punctate pulmonary nodule within the  right upper lobe (3:35). Few other scattered  pulmonary micronodules. No pulmonary mass. No pleural effusion. No pneumothorax.   Upper Abdomen: No acute abnormality.   Musculoskeletal:   No chest wall abnormality.   No suspicious lytic or blastic osseous lesions. No acute displaced fracture. Grade 1 anterolisthesis of C7 on T1. Degenerative changes of the visualized cervical spine.   IMPRESSION: 1. Cluster of subsolid left lower lobe micronodules as well as a 5 mm subsolid nodule. Findings may represent an atypical infection/inflammation. Otherwise several scattered calcified and noncalcified pulmonary micronodules. Non-contrast chest CT at 3-6 months is recommended. If nodules persist and are stable at that time, consider additional non-contrast chest CT examinations at 2 and 4 years. This recommendation follows the consensus statement: Guidelines for Management of Incidental Pulmonary Nodules Detected on CT Images: From the Fleischner Society 2017; Radiology 2017; 284:228-243. 2. At least small volume hiatal hernia. 3. Aortic Atherosclerosis (ICD10-I70.0) including at least 3 vessel coronary calcifications.     Electronically Signed   By: Iven Finn M.D.   On: 01/08/2021 21:32  Results for orders placed or performed in visit on 04/07/21  TSH  Result Value Ref Range   TSH 2.62 0.40 - 4.50 mIU/L  PSA  Result Value Ref Range   PSA 2.31 < OR = 4.00 ng/mL  Lipid panel  Result Value Ref Range   Cholesterol 150 <200 mg/dL   HDL 74 > OR = 40 mg/dL   Triglycerides 98 <150 mg/dL   LDL Cholesterol (Calc) 58 mg/dL (calc)   Total CHOL/HDL Ratio 2.0 <5.0 (calc)   Non-HDL Cholesterol (Calc) 76 <130 mg/dL (calc)  COMPLETE METABOLIC PANEL WITH GFR  Result Value Ref Range   Glucose, Bld 171 (H) 65 - 99 mg/dL   BUN 22 7 - 25 mg/dL   Creat 1.32 (H) 0.70 - 1.28 mg/dL   eGFR 56 (L) > OR = 60 mL/min/1.46m   BUN/Creatinine Ratio 17 6 - 22 (calc)   Sodium 139 135 - 146 mmol/L    Potassium 4.3 3.5 - 5.3 mmol/L   Chloride 101 98 - 110 mmol/L   CO2 27 20 - 32 mmol/L   Calcium 9.0 8.6 - 10.3 mg/dL   Total Protein 6.5 6.1 - 8.1 g/dL   Albumin 4.1 3.6 - 5.1 g/dL   Globulin 2.4 1.9 - 3.7 g/dL (calc)   AG Ratio 1.7 1.0 - 2.5 (calc)   Total Bilirubin 0.4 0.2 - 1.2 mg/dL   Alkaline phosphatase (APISO) 65 35 - 144 U/L   AST 19 10 - 35 U/L   ALT 18 9 - 46 U/L  CBC with Differential/Platelet  Result Value Ref Range   WBC 7.4 3.8 - 10.8 Thousand/uL   RBC 5.44 4.20 - 5.80 Million/uL   Hemoglobin 15.8 13.2 - 17.1 g/dL   HCT 48.8 38.5 - 50.0 %   MCV 89.7 80.0 - 100.0 fL   MCH 29.0 27.0 - 33.0 pg   MCHC 32.4 32.0 - 36.0 g/dL   RDW 12.9 11.0 - 15.0 %   Platelets 213 140 - 400 Thousand/uL   MPV 10.6 7.5 - 12.5 fL   Neutro Abs 4,795 1,500 - 7,800 cells/uL   Lymphs Abs 1,695 850 - 3,900 cells/uL   Absolute Monocytes 681 200 - 950 cells/uL   Eosinophils Absolute 170 15 - 500 cells/uL   Basophils Absolute 59 0 - 200 cells/uL   Neutrophils Relative % 64.8 %   Total Lymphocyte 22.9 %   Monocytes Relative 9.2 %   Eosinophils Relative 2.3 %  Basophils Relative 0.8 %  Hemoglobin A1c  Result Value Ref Range   Hgb A1c MFr Bld 7.3 (H) <5.7 % of total Hgb   Mean Plasma Glucose 163 mg/dL   eAG (mmol/L) 9.0 mmol/L   Results for orders placed or performed in visit on 04/15/21 (from the past 24 hour(s))  POCT UA - Microalbumin     Status: Abnormal   Collection Time: 04/15/21  4:06 PM  Result Value Ref Range   Microalbumin Ur, POC 20 mg/L   Creatinine, POC     Albumin/Creatinine Ratio, Urine, POC          Assessment & Plan:   Problem List Items Addressed This Visit     Type 2 diabetes mellitus with other specified complication ([place]) - Primary    Well controlled T2DM A1c 7.3 currently - within range 7-8 or less Complications - CKDIII, GERD, cataracts  Plan:  1. Continue current therapy - Trijardy 25-5-1000mg daily 2. Encourage improved lifestyle - low carb, low sugar  diet, reduce portion size, continue improving regular exercise 3. Check CBG , bring log to next visit for review 4. Due for Urine microalbumin today 5. Remain on LOWER dose Statin now      Relevant Medications   atorvastatin (LIPITOR) 10 MG tablet   Other Relevant Orders   POCT UA - Microalbumin (Completed)   Stage 3a chronic kidney disease ([place])    See A&P HTN Followed by Nephrology      Relevant Orders   POCT UA - Microalbumin (Completed)   Hyperlipidemia associated with type 2 diabetes mellitus ([place])    Major improved LDL back on statin therapy tolerating well The 10-year ASCVD risk score (Arnett DK, et al., 2019) is: 44.8%  Prior CVA  Plan: 1. Continue Atorvastatin 63m daily doing well without myalgia 2. Remains off ASA 3. Encourage improved lifestyle - low carb/cholesterol, reduce portion size, continue improving regular exercise      Relevant Medications   atorvastatin (LIPITOR) 10 MG tablet   Benign prostatic hyperplasia with urinary frequency    Stable chronic BPH PSA has normalized      Benign hypertension with CKD (chronic kidney disease) stage III (HCC)    Well-controlled HTN Complication with CKD IIIa - Followed by Dr LHolley RaringLast urine microalbumin was 0 (12/2019) - due today again   Plan:  1. No medications - is on SGLT2 currently 2. Encourage improved lifestyle - low sodium diet, regular exercise 3. Monitor BP outside office, bring readings to next visit, if persistently >140/90 or new symptoms notify office sooner  He is off ACEi/ARB - would ask Nephrology if can reconsider therapy if indicated      Relevant Medications   atorvastatin (LIPITOR) 10 MG tablet   Atherosclerosis of aorta (HCC)    Stable, seen on imaging On Statin therapy      Relevant Medications   atorvastatin (LIPITOR) 10 MG tablet   Other Visit Diagnoses     Needs flu shot       Relevant Orders   Flu Vaccine QUAD High Dose(Fluad) (Completed)   Family history of lung cancer        Relevant Orders   AMB  Referral to Pulmonary Nodule Clinic   Left lower lobe pulmonary nodule       Relevant Orders   AMB  Referral to Pulmonary Nodule Clinic   Diabetic polyneuropathy associated with type 2 diabetes mellitus ([place]       Relevant Medications   atorvastatin (LIPITOR) 10  MG tablet   gabapentin (NEURONTIN) 100 MG capsule       Updated Health Maintenance information Flu Shot today. Reviewed recent lab results with patient Encouraged improvement to lifestyle with diet and exercise Goal of weight loss  Referral to [facility] for prior abnormal CT Lung imaging with micronodule small pulm nodules, repeat advised 3-6 months.    Orders Placed This Encounter  Procedures   Flu Vaccine QUAD High Dose(Fluad)   AMB  Referral to Pulmonary Nodule Clinic    Referral Priority:   Routine    Referral Type:   Consultation    Referral Reason:   Specialty Services Required    Number of Visits Requested:   1   POCT UA - Microalbumin      Meds ordered this encounter  Medications   gabapentin (NEURONTIN) 100 MG capsule    Sig: Take 1 capsule (100 mg total) by mouth at bedtime.    Dispense:  90 capsule    Refill:  3       Follow up plan: Return in about 6 months (around 10/14/2021) for 6 month Lab only then 1 week later Follow-up DM, CKD.  Nobie Putnam, [facility] 04/15/2021, 9:13 AM

## 2021-04-15 NOTE — Assessment & Plan Note (Signed)
Well controlled T2DM A1c 7.3 currently - within range 7-8 or less Complications - CKDIII, GERD, cataracts  Plan:  1. Continue current therapy - Trijardy 25-5-1000mg  daily 2. Encourage improved lifestyle - low carb, low sugar diet, reduce portion size, continue improving regular exercise 3. Check CBG , bring log to next visit for review 4. Due for Urine microalbumin today 5. Remain on LOWER dose Statin now

## 2021-04-15 NOTE — Assessment & Plan Note (Signed)
Well-controlled HTN Complication with CKD IIIa - Followed by Dr Holley Raring Last urine microalbumin was 0 (12/2019) - due today again   Plan:  1. No medications - is on SGLT2 currently 2. Encourage improved lifestyle - low sodium diet, regular exercise 3. Monitor BP outside office, bring readings to next visit, if persistently >140/90 or new symptoms notify office sooner  He is off ACEi/ARB - would ask Nephrology if can reconsider therapy if indicated

## 2021-04-15 NOTE — Patient Instructions (Addendum)
Thank you for coming to the office today.  Referral to Pulmonary Nodule Clinic in [facility] at the [facility]. Stay tuned they will call you with apt and repeat imaging.  Flu Shot today.  COVID Booster updated with omicron at pharmacy  Start Gabapentin in evening 100mg  1 pill, for a week or more, and then if you want can increase to 2 to 3 pills at once max, may skip days or week if you prefer.  SCHEDULE "Lab Only" visit in the morning at the clinic for lab draw in  6 MONTHS   - Make sure Lab Only appointment is at about 1 week before your next appointment, so that results will be available  For Lab Results, once available within 2-3 days of blood draw, you can can log in to MyChart online to view your results and a brief explanation. Also, we can discuss results at next follow-up visit.    Please schedule a Follow-up Appointment to: Return in about 6 months (around 10/14/2021) for 6 month Lab only then 1 week later Follow-up DM, CKD.  If you have any other questions or concerns, please feel free to call the office or send a message through [place]. You may also schedule an earlier appointment if necessary.  Additionally, you may be receiving a survey about your experience at our office within a few days to 1 week by e-mail or mail. We value your feedback.  Nobie Putnam, DO [place]

## 2021-04-15 NOTE — Assessment & Plan Note (Signed)
Major improved LDL back on statin therapy tolerating well The 10-year ASCVD risk score (Arnett DK, et al., 2019) is: 44.8%  Prior CVA  Plan: 1. Continue Atorvastatin 10mg  daily doing well without myalgia 2. Remains off ASA 3. Encourage improved lifestyle - low carb/cholesterol, reduce portion size, continue improving regular exercise

## 2021-04-15 NOTE — Assessment & Plan Note (Signed)
Stable chronic BPH PSA has normalized

## 2021-04-22 ENCOUNTER — Encounter: Payer: Self-pay | Admitting: Dermatology

## 2021-04-22 ENCOUNTER — Other Ambulatory Visit: Payer: Self-pay

## 2021-04-22 ENCOUNTER — Ambulatory Visit (INDEPENDENT_AMBULATORY_CARE_PROVIDER_SITE_OTHER): Payer: Medicare Other | Admitting: Dermatology

## 2021-04-22 DIAGNOSIS — D492 Neoplasm of unspecified behavior of bone, soft tissue, and skin: Secondary | ICD-10-CM

## 2021-04-22 DIAGNOSIS — L988 Other specified disorders of the skin and subcutaneous tissue: Secondary | ICD-10-CM

## 2021-04-22 DIAGNOSIS — D485 Neoplasm of uncertain behavior of skin: Secondary | ICD-10-CM | POA: Diagnosis not present

## 2021-04-22 MED ORDER — MUPIROCIN 2 % EX OINT
1.0000 | TOPICAL_OINTMENT | Freq: Every day | CUTANEOUS | 1 refills | Status: DC
Start: 2021-04-22 — End: 2021-06-02

## 2021-04-22 NOTE — Progress Notes (Signed)
   Follow-Up Visit   Subjective  Jorge Mayer is a 75 y.o. male who presents for the following: Moderate to severe dysplastic nevus, bx proven (LUQA, pt presents for excision).  The following portions of the chart were reviewed this encounter and updated as appropriate:   Tobacco  Allergies  Meds  Problems  Med Hx  Surg Hx  Fam Hx     Review of Systems:  No other skin or systemic complaints except as noted in HPI or Assessment and Plan.  Objective  Well appearing patient in no apparent distress; mood and affect are within normal limits.  A focused examination was performed including abdomen. Relevant physical exam findings are noted in the Assessment and Plan.  LUQA Pink bx site 1.5 x 0.8cm   Assessment & Plan  Neoplasm of skin LUQA  Skin excision  Lesion length (cm):  1.5 Lesion width (cm):  0.8 Margin per side (cm):  0.2 Total excision diameter (cm):  1.9 Informed consent: discussed and consent obtained   Timeout: patient name, date of birth, surgical site, and procedure verified   Procedure prep:  Patient was prepped and draped in usual sterile fashion Prep type:  Isopropyl alcohol and povidone-iodine Anesthesia: the lesion was anesthetized in a standard fashion   Anesthetic:  1% lidocaine w/ epinephrine 1-100,000 buffered w/ 8.4% NaHCO3 (3cc lido w/ epi, 3cc bupivicaine, total 6cc) Instrument used: #15 blade   Hemostasis achieved with: pressure   Hemostasis achieved with comment:  Electrocautery Outcome: patient tolerated procedure well with no complications   Post-procedure details: sterile dressing applied and wound care instructions given   Dressing type: bandage and pressure dressing (Mupirocin)    Skin repair Complexity:  Complex Final length (cm):  4 Reason for type of repair: reduce tension to allow closure, reduce the risk of dehiscence, infection, and necrosis, reduce subcutaneous dead space and avoid a hematoma, allow closure of the large defect,  preserve normal anatomy, preserve normal anatomical and functional relationships and enhance both functionality and cosmetic results   Undermining: area extensively undermined   Undermining comment:  Undermining Defect 1.9cm Subcutaneous layers (deep stitches):  Suture size:  3-0 Suture type: Vicryl (polyglactin 910)   Subcutaneous suture technique: Inverted Dermal. Fine/surface layer approximation (top stitches):  Suture size:  3-0 Suture type: nylon   Stitches: simple running   Suture removal (days):  7 Hemostasis achieved with: pressure Outcome: patient tolerated procedure well with no complications   Post-procedure details: sterile dressing applied and wound care instructions given   Dressing type: bandage, pressure dressing and bacitracin (Mupirocin)    mupirocin ointment (BACTROBAN) 2 % Apply 1 application topically daily. Qd to excision site  Specimen 1 - Surgical pathology Differential Diagnosis: D48.5 Bx proven moderate to severe dyspalstic nevus  Check Margins: yes Pink bx site [phone]  Moderate to Severe dysplastic nevus, bx proven  Start Mupirocin oint qd with dressing changes  Return in about 1 week (around 04/29/2021) for suture removal.  I, [name], RMA, am acting as scribe for Sarina Ser, MD . Documentation: I have reviewed the above documentation for accuracy and completeness, and I agree with the above.  Sarina Ser, MD

## 2021-04-22 NOTE — Patient Instructions (Signed)
Wound Care Instructions  Cleanse wound gently with soap and water once a day then pat dry with clean gauze. Apply a thing coat of Petrolatum (petroleum jelly, "Vaseline") over the wound (unless you have an allergy to this). We recommend that you use a new, sterile tube of Vaseline. Do not pick or remove scabs. Do not remove the yellow or white "healing tissue" from the base of the wound.  Cover the wound with fresh, clean, nonstick gauze and secure with paper tape. You may use Band-Aids in place of gauze and tape if the would is small enough, but would recommend trimming much of the tape off as there is often too much. Sometimes Band-Aids can irritate the skin.  You should call the office for your biopsy report after 1 week if you have not already been contacted.  If you experience any problems, such as abnormal amounts of bleeding, swelling, significant bruising, significant pain, or evidence of infection, please call the office immediately.  FOR ADULT SURGERY PATIENTS: If you need something for pain relief you may take 1 extra strength Tylenol (acetaminophen) AND 2 Ibuprofen (200mg each) together every 4 hours as needed for pain. (do not take these if you are allergic to them or if you have a reason you should not take them.) Typically, you may only need pain medication for 1 to 3 days.   If you have any questions or concerns for your doctor, please call our main line at [phone] and press option 4 to reach your doctor's medical assistant. If no one answers, please leave a voicemail as directed and we will return your call as soon as possible. Messages left after 4 pm will be answered the following business day.   You may also send us a message via MyChart. We typically respond to MyChart messages within 1-2 business days.  For prescription refills, please ask your pharmacy to contact our office. Our fax number is [phone].  If you have an urgent issue when the clinic is closed that  cannot wait until the next business day, you can page your doctor at the number below.    Please note that while we do our best to be available for urgent issues outside of office hours, we are not available 24/7.   If you have an urgent issue and are unable to reach us, you may choose to seek medical care at your doctor's office, retail clinic, urgent care center, or emergency room.  If you have a medical emergency, please immediately call 911 or go to the emergency department.  Pager Numbers  - Dr. Kowalski: [phone]  - Dr. Moye: [phone]  - Dr. Stewart: [phone]  In the event of inclement weather, please call our main line at [phone] for an update on the status of any delays or closures.  Dermatology Medication Tips: Please keep the boxes that topical medications come in in order to help keep track of the instructions about where and how to use these. Pharmacies typically print the medication instructions only on the boxes and not directly on the medication tubes.   If your medication is too expensive, please contact our office at [phone] option 4 or send us a message through MyChart.   We are unable to tell what your co-pay for medications will be in advance as this is different depending on your insurance coverage. However, we may be able to find a substitute medication at lower cost or fill out paperwork to get insurance to cover a needed   medication.   If a prior authorization is required to get your medication covered by your insurance company, please allow us 1-2 business days to complete this process.  Drug prices often vary depending on where the prescription is filled and some pharmacies may offer cheaper prices.  The website www.goodrx.com contains coupons for medications through different pharmacies. The prices here do not account for what the cost may be with help from insurance (it may be cheaper with your insurance), but the website can give you the  price if you did not use any insurance.  - You can print the associated coupon and take it with your prescription to the pharmacy.  - You may also stop by our office during regular business hours and pick up a GoodRx coupon card.  - If you need your prescription sent electronically to a different pharmacy, notify our office through [facility] MyChart or by phone at [phone] option 4.   

## 2021-04-23 ENCOUNTER — Telehealth: Payer: Self-pay

## 2021-04-23 NOTE — Telephone Encounter (Signed)
Left pt message to call if any problems after yesterdays surgery./sh 

## 2021-04-25 ENCOUNTER — Encounter: Payer: Self-pay | Admitting: Dermatology

## 2021-04-29 ENCOUNTER — Other Ambulatory Visit: Payer: Self-pay

## 2021-04-29 ENCOUNTER — Other Ambulatory Visit: Payer: Self-pay | Admitting: Family Medicine

## 2021-04-29 ENCOUNTER — Ambulatory Visit (INDEPENDENT_AMBULATORY_CARE_PROVIDER_SITE_OTHER): Payer: Medicare Other | Admitting: Dermatology

## 2021-04-29 DIAGNOSIS — Z4802 Encounter for removal of sutures: Secondary | ICD-10-CM

## 2021-04-29 DIAGNOSIS — E1169 Type 2 diabetes mellitus with other specified complication: Secondary | ICD-10-CM

## 2021-04-29 DIAGNOSIS — D235 Other benign neoplasm of skin of trunk: Secondary | ICD-10-CM

## 2021-04-29 DIAGNOSIS — D239 Other benign neoplasm of skin, unspecified: Secondary | ICD-10-CM

## 2021-04-29 NOTE — Progress Notes (Signed)
   Follow-Up Visit   Subjective  Jorge Mayer is a 75 y.o. male who presents for the following: Dysplastic nevus margins free, bx proven (LUQA, 1 wk f/u pt presents for suture removal).  The following portions of the chart were reviewed this encounter and updated as appropriate:   Tobacco  Allergies  Meds  Problems  Med Hx  Surg Hx  Fam Hx     Review of Systems:  No other skin or systemic complaints except as noted in HPI or Assessment and Plan.  Objective  Well appearing patient in no apparent distress; mood and affect are within normal limits.  A focused examination was performed including abdomen. Relevant physical exam findings are noted in the Assessment and Plan.  LUQA Healing excision site   Assessment & Plan  Dysplastic nevus LUQA  Margins free, bx proven  Encounter for Removal of Sutures - Incision site at the [place] is clean, dry and intact - Wound cleansed, sutures removed, wound cleansed and steri strips applied.  - Discussed pathology results showing dysplastic nevus margins free.  - Patient advised to keep steri-strips dry until they fall off. - Scars remodel for a full year. - Once steri-strips fall off, patient can apply over-the-counter silicone scar cream each night to help with scar remodeling if desired. - Patient advised to call with any concerns or if they notice any new or changing lesions.   Return for as scheduled for TBSE hx of dysplastic nevi, hx of SCC.  I, [name], RMA, am acting as scribe for Sarina Ser, MD . Documentation: I have reviewed the above documentation for accuracy and completeness, and I agree with the above.  Sarina Ser, MD

## 2021-04-29 NOTE — Telephone Encounter (Signed)
Called [facility] and spoke with Alroy Dust (pharmacist). Informed him that this med was dc'd 12/05/20 by Zara Council PA. Pharmacist stated he will remove med off profile.

## 2021-04-29 NOTE — Patient Instructions (Signed)

## 2021-04-30 ENCOUNTER — Encounter: Payer: Self-pay | Admitting: Dermatology

## 2021-05-02 ENCOUNTER — Other Ambulatory Visit: Payer: Self-pay | Admitting: *Deleted

## 2021-05-02 DIAGNOSIS — R918 Other nonspecific abnormal finding of lung field: Secondary | ICD-10-CM

## 2021-05-02 NOTE — Progress Notes (Signed)
Lung nodule clinic transition from [place] to [place]. New referral placed for [facility] to schedule further follow up.

## 2021-05-17 ENCOUNTER — Encounter: Payer: Self-pay | Admitting: Family Medicine

## 2021-05-17 DIAGNOSIS — I700 Atherosclerosis of aorta: Secondary | ICD-10-CM

## 2021-05-17 DIAGNOSIS — E1169 Type 2 diabetes mellitus with other specified complication: Secondary | ICD-10-CM

## 2021-05-19 ENCOUNTER — Other Ambulatory Visit: Payer: Self-pay

## 2021-05-19 DIAGNOSIS — E785 Hyperlipidemia, unspecified: Secondary | ICD-10-CM

## 2021-05-19 DIAGNOSIS — E1169 Type 2 diabetes mellitus with other specified complication: Secondary | ICD-10-CM

## 2021-05-22 MED ORDER — ATORVASTATIN CALCIUM 10 MG PO TABS: 10.0000 mg | ORAL_TABLET | Freq: Every day | ORAL | 3 refills | Status: DC

## 2021-06-02 ENCOUNTER — Other Ambulatory Visit: Payer: Self-pay

## 2021-06-02 ENCOUNTER — Ambulatory Visit (INDEPENDENT_AMBULATORY_CARE_PROVIDER_SITE_OTHER): Payer: Medicare Other | Admitting: Pulmonary Disease

## 2021-06-02 ENCOUNTER — Encounter: Payer: Self-pay | Admitting: Pulmonary Disease

## 2021-06-02 VITALS — BP 130/78 | HR 96 | Temp 97.7°F | Ht 69.5 in | Wt 192.4 lb

## 2021-06-02 DIAGNOSIS — K449 Diaphragmatic hernia without obstruction or gangrene: Secondary | ICD-10-CM | POA: Diagnosis not present

## 2021-06-02 DIAGNOSIS — K219 Gastro-esophageal reflux disease without esophagitis: Secondary | ICD-10-CM

## 2021-06-02 DIAGNOSIS — R918 Other nonspecific abnormal finding of lung field: Secondary | ICD-10-CM

## 2021-06-02 MED ORDER — ESOMEPRAZOLE MAGNESIUM 40 MG PO CPDR: 40.0000 mg | DELAYED_RELEASE_CAPSULE | Freq: Every day | ORAL | 3 refills | Status: DC

## 2021-06-02 NOTE — Patient Instructions (Addendum)
We are repeating a chest CT to follow-up on your nodules.  I am increasing her Nexium to 40 mg daily.  We also discussed antireflux measures in particular, wait at least 2 to 3 hours before going to bed after meal.  We will see you in follow-up in 6 months time, we will call you the results of your CT once these are known.  May consider referral to gastroenterology to keep your reflux issues particularly in view of your hiatal hernia.

## 2021-06-02 NOTE — Progress Notes (Signed)
Subjective:    Patient ID: Jorge Mayer, male    DOB: Mar 30, 1946, 75 y.o.   MRN: [id number] Chief Complaint  Patient presents with   Consult    Lung Nodule-    HPI Mr. Lindon Romp is a 75 year old former smoker (10 PY) who presents for evaluation of multiple lung nodules.  Patient is being transitioned from the [facility] lung nod[facility] to the [facility].  He is kindly referred by Beckey Rutter, NP, his primary care physician is Nobie Putnam DO. His most recent CT scan of the chest was performed on 08 January 2021, he is due for repeat CT.  On the prior CT he was noted to have a cluster of soft solid left lower lobe micronodules as well as a 5 mm of solid nodule.  Of note he has a hiatal hernia also noted and does have frequent episodes of gastroesophageal reflux with regurgitation into the back of his throat.  He notes that his reflux has improved with intentional/bland weight loss however he continues to have these issues from time to time.  Previously he resided in [place] and was followed closely by GI for potential Barrett's.  He has not had this follow-up in a number of years.  I reviewed the CT scan with the patient and his wife.  The micronodules in question may represent a focal area of chronic aspiration bronchiolitis.  The patient is currently asymptomatic with regards to the lung nodules.  He has had no dyspnea, fevers, chills or sweats.  No cough or sputum production.  No hemoptysis.  He has had weight loss that has been intentional/planned.  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  His smoking history was limited to 10 pack years.   Review of Systems A 10 point review of systems was performed and it is as noted above otherwise negative.  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, txted in [place]   Stroke [facility] [place])    Urinary incontinence    Past Surgical History:  Procedure Laterality Date   SHOULDER SURGERY     Right side   Family History  Problem Relation Age of Onset   Heart disease Mother    Heart disease Father    Lung cancer Brother 69   Social History   Tobacco Use   Smoking status: Former    Packs/day: 1.00    Years: 10.00    Pack years: 10.00    Types: Cigarettes    Quit date: 07/06/1974    Years since quitting: 46.9   Smokeless tobacco: Never  Substance Use Topics   Alcohol use: Yes    Alcohol/week: 1.0 standard drink    Types: 1 Standard drinks or equivalent per week   No Known Allergies  Current Meds  Medication Sig   atorvastatin (LIPITOR) 10 MG tablet Take 1 tablet (10 mg total) by mouth at bedtime.   Cholecalciferol (D3-1000) 25 MCG (1000 UT) tablet Take 1,000 Units by mouth daily.   Continuous Blood Gluc Receiver (FREESTYLE LIBRE 2 READER) DEVI Use to check blood sugar   Continuous Blood Gluc Sensor (FREESTYLE  LIBRE 2 SENSOR) MISC Use to check blood sugar   esomeprazole (NEXIUM) 20 MG capsule TAKE 1 CAPSULE DAILY BEFOREBREAKFAST   Magnesium 400 MG TABS Take by mouth.   Specialty Vitamins Products (MAGNESIUM, AMINO ACID CHELATE,) 133 MG tablet Take 1 tablet by mouth daily. As per pt takes 400 mg  once daily.   TRIJARDY XR 25-11-998 MG TB24 TAKE 1 TABLET DAILY   [DISCONTINUED] gabapentin (NEURONTIN) 100 MG capsule Take 1 capsule (100 mg total) by mouth at bedtime.   [DISCONTINUED] mupirocin ointment (BACTROBAN) 2 % Apply 1 application topically daily. Qd to excision site   Immunization History  Administered Date(s) Administered   Fluad Quad(high Dose 65+) 05/10/2020, 04/15/2021   Moderna Sars-Covid-2 Vaccination 08/04/2019, 08/31/2019, 05/21/2020, 10/15/2020   Pneumococcal  Conjugate-13 05/10/2020   Zoster Recombinat (Shingrix) 05/15/2020, 09/10/2020      Objective:   Physical Exam BP 130/78 (BP Location: Left Arm, Patient Position: Sitting, Cuff Size: Normal)   Pulse 96   Temp 97.7 F (36.5 C) (Oral)   Ht 5' 9.5" (1.765 m)   Wt 192 lb 6.4 oz (87.3 kg)   SpO2 98%   BMI 28.01 kg/m  GENERAL: Well-developed, well-nourished gentleman, no acute distress, fully ambulatory.  No conversational dyspnea. HEAD: Normocephalic, atraumatic.  EYES: Pupils equal, round, reactive to light.  No scleral icterus.  MOUTH: Nose/mouth/throat not examined due to masking requirements for COVID 19. NECK: Supple. No thyromegaly. Trachea midline. No JVD.  No adenopathy. PULMONARY: Good air entry bilaterally.  No adventitious sounds. CARDIOVASCULAR: S1 and S2. Regular rate and rhythm.  No rubs, murmurs or gallops heard. ABDOMEN: Benign. MUSCULOSKELETAL: No joint deformity, no clubbing, no edema.  NEUROLOGIC: No focal deficit, no gait disturbance, speech is fluent. SKIN: Intact,warm,dry. PSYCH: Mood and behavior normal.   Representative slice of CT performed 08 January 2021 showing 1 of multiple micronodules in the left lower lobe:      Assessment & Plan:     ICD-10-CM   1. Multiple lung nodules on CT  R91.8 CT CHEST WO CONTRAST   We will repeat CT chest without contrast Follow-up CT per Fleischner Society recommendations    2. Hiatal hernia with GERD  K44.9    K21.9    Increase Nexium to 40 mg daily Antireflux measures Consider referral to GI, defer to primary care     Orders Placed This Encounter  Procedures   CT CHEST WO CONTRAST    Standing Status:   Future    Standing Expiration Date:   06/02/2022    Order Specific Question:   Preferred imaging location?    Answer:   [facility] ordered this encounter  Medications   esomeprazole (NEXIUM) 40 MG capsule    Sig: Take 1 capsule (40 mg total) by mouth daily.    Dispense:  90 capsule    Refill:  3    We will notify the patient results of the CT scan once available.  Patient continues to be quite symptomatic with regards to reflux.  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  If his symptoms persist despite increasing Nexium to 40 mg daily consider referral to GI.  I will defer this decision to his primary care team.  See him in follow-up in 6 months time he is to contact us prior to that time should any new difficulties arise.     Renold Don, MD Advanced Bronchoscopy PCCM [facility]    *  This note was dictated using voice recognition software/Dragon.  Despite best efforts to proofread, errors can occur which can change the meaning.  Any change was purely unintentional.

## 2021-06-10 ENCOUNTER — Ambulatory Visit
Admission: RE | Admit: 2021-06-10 | Discharge: 2021-06-10 | Disposition: A | Discharge status: Home / Self Care | Payer: Medicare Other | Source: Ambulatory Visit | Attending: Pulmonary Disease | Admitting: Pulmonary Disease

## 2021-06-10 ENCOUNTER — Other Ambulatory Visit: Payer: Self-pay

## 2021-06-10 DIAGNOSIS — R918 Other nonspecific abnormal finding of lung field: Secondary | ICD-10-CM | POA: Diagnosis present

## 2021-06-11 ENCOUNTER — Other Ambulatory Visit: Payer: Self-pay

## 2021-06-11 ENCOUNTER — Encounter: Payer: Self-pay | Admitting: Family Medicine

## 2021-06-11 DIAGNOSIS — R918 Other nonspecific abnormal finding of lung field: Secondary | ICD-10-CM

## 2021-06-11 DIAGNOSIS — E041 Nontoxic single thyroid nodule: Secondary | ICD-10-CM | POA: Insufficient documentation

## 2021-06-22 ENCOUNTER — Encounter: Payer: Self-pay | Admitting: Family Medicine

## 2021-06-22 DIAGNOSIS — E041 Nontoxic single thyroid nodule: Secondary | ICD-10-CM

## 2021-06-24 ENCOUNTER — Ambulatory Visit (INDEPENDENT_AMBULATORY_CARE_PROVIDER_SITE_OTHER): Payer: Medicare Other | Admitting: Dermatology

## 2021-06-24 ENCOUNTER — Other Ambulatory Visit: Payer: Self-pay

## 2021-06-24 DIAGNOSIS — L821 Other seborrheic keratosis: Secondary | ICD-10-CM | POA: Diagnosis not present

## 2021-06-24 DIAGNOSIS — Z86018 Personal history of other benign neoplasm: Secondary | ICD-10-CM | POA: Diagnosis not present

## 2021-06-24 DIAGNOSIS — L578 Other skin changes due to chronic exposure to nonionizing radiation: Secondary | ICD-10-CM

## 2021-06-24 DIAGNOSIS — L820 Inflamed seborrheic keratosis: Secondary | ICD-10-CM | POA: Diagnosis not present

## 2021-06-24 DIAGNOSIS — L570 Actinic keratosis: Secondary | ICD-10-CM

## 2021-06-24 NOTE — Patient Instructions (Signed)

## 2021-06-24 NOTE — Progress Notes (Signed)
° °  Follow-Up Visit   Subjective  Jorge Mayer is a 75 y.o. male who presents for the following: irregular skin lesion (On the R side - changing in appearance and growing.). The patient has spots, moles and lesions to be evaluated, some may be new or changing.  The following portions of the chart were reviewed this encounter and updated as appropriate:   Tobacco   Allergies   Meds   Problems   Med Hx   Surg Hx   Fam Hx      Review of Systems:  No other skin or systemic complaints except as noted in HPI or Assessment and Plan.  Objective  Well appearing patient in no apparent distress; mood and affect are within normal limits.  A focused examination was performed including the trunk. Relevant physical exam findings are noted in the Assessment and Plan.  R side x 2 Erythematous stuck-on, waxy papule or plaque  Scalp and ears x 6 (6) Erythematous thin papules/macules with gritty scale.    Assessment & Plan  Inflamed seborrheic keratosis R side x 2  Destruction of lesion - R side x 2 Complexity: simple   Destruction method: cryotherapy   Informed consent: discussed and consent obtained   Timeout:  patient name, date of birth, surgical site, and procedure verified Lesion destroyed using liquid nitrogen: Yes   Region frozen until ice ball extended beyond lesion: Yes   Outcome: patient tolerated procedure well with no complications   Post-procedure details: wound care instructions given    AK (actinic keratosis) (6) Scalp and ears x 6  Destruction of lesion - Scalp and ears x 6 Complexity: simple   Destruction method: cryotherapy   Informed consent: discussed and consent obtained   Timeout:  patient name, date of birth, surgical site, and procedure verified Lesion destroyed using liquid nitrogen: Yes   Region frozen until ice ball extended beyond lesion: Yes   Outcome: patient tolerated procedure well with no complications   Post-procedure details: wound care instructions  given    Actinic Damage - chronic, secondary to cumulative UV radiation exposure/sun exposure over time - diffuse scaly erythematous macules with underlying dyspigmentation - Recommend daily broad spectrum sunscreen SPF 30+ to sun-exposed areas, reapply every 2 hours as needed.  - Recommend staying in the shade or wearing long sleeves, sun glasses (UVA+UVB protection) and wide brim hats (4-inch brim around the entire circumference of the hat). - Call for new or changing lesions.  History of Dysplastic Nevus - No evidence of recurrence today - Recommend regular full body skin exams - Recommend daily broad spectrum sunscreen SPF 30+ to sun-exposed areas, reapply every 2 hours as needed.  - Call if any new or changing lesions are noted between office visits  Seborrheic Keratoses - Stuck-on, waxy, tan-brown papules and/or plaques  - Benign-appearing - Discussed benign etiology and prognosis. - Observe - Call for any changes  Return in about 6 months (around 12/23/2021) for TBSE.  Luther Redo, CMA, am acting as scribe for Sarina Ser, MD . Documentation: I have reviewed the above documentation for accuracy and completeness, and I agree with the above.  Sarina Ser, MD

## 2021-07-01 ENCOUNTER — Ambulatory Visit
Admission: RE | Admit: 2021-07-01 | Discharge: 2021-07-01 | Disposition: A | Discharge status: Home / Self Care | Payer: Medicare Other | Source: Ambulatory Visit | Attending: Family Medicine | Admitting: Family Medicine

## 2021-07-01 ENCOUNTER — Other Ambulatory Visit: Payer: Self-pay

## 2021-07-01 ENCOUNTER — Other Ambulatory Visit: Payer: Self-pay | Admitting: Family Medicine

## 2021-07-01 DIAGNOSIS — E041 Nontoxic single thyroid nodule: Secondary | ICD-10-CM

## 2021-07-05 ENCOUNTER — Encounter: Payer: Self-pay | Admitting: Family Medicine

## 2021-07-05 ENCOUNTER — Encounter: Payer: Self-pay | Admitting: Dermatology

## 2021-08-05 ENCOUNTER — Other Ambulatory Visit: Payer: Self-pay | Admitting: Otolaryngology

## 2021-08-05 DIAGNOSIS — E041 Nontoxic single thyroid nodule: Secondary | ICD-10-CM

## 2021-08-14 ENCOUNTER — Other Ambulatory Visit: Payer: Self-pay

## 2021-08-14 ENCOUNTER — Ambulatory Visit
Admission: RE | Admit: 2021-08-14 | Discharge: 2021-08-14 | Disposition: A | Discharge status: Home / Self Care | Payer: Medicare Other | Source: Ambulatory Visit | Attending: Otolaryngology | Admitting: Otolaryngology

## 2021-08-14 DIAGNOSIS — D34 Benign neoplasm of thyroid gland: Secondary | ICD-10-CM | POA: Diagnosis not present

## 2021-08-14 DIAGNOSIS — E041 Nontoxic single thyroid nodule: Secondary | ICD-10-CM

## 2021-08-14 NOTE — Procedures (Signed)
Successful US guided FNA of right superior thyroid nodule No complications. See PACS for full report.    Narda Rutherford, AGNP-BC 08/14/2021, 2:34 PM

## 2021-08-15 LAB — CYTOLOGY - NON PAP

## 2021-09-06 ENCOUNTER — Other Ambulatory Visit: Payer: Self-pay | Admitting: Family Medicine

## 2021-09-06 DIAGNOSIS — E1169 Type 2 diabetes mellitus with other specified complication: Secondary | ICD-10-CM

## 2021-09-08 NOTE — Telephone Encounter (Signed)
Requested medications are due for refill today.  unsure ? ?Requested medications are on the active medications list.  yes ? ?Last refill. 12/09/2020 #90 1 refill ? ?Future visit scheduled.   yes ? ?Notes to clinic.  Pt should be out of medication since 06/2021. No protocol assigned for this medication. Please review. ? ? ? ?Requested Prescriptions  ?Pending Prescriptions Disp Refills  ? TRIJARDY XR 25-11-998 MG TB24 [Pharmacy Med Name: TRIJARDYXR1G TAB 25-5 MG] 90 tablet 1  ?  Sig: TAKE 1 TABLET DAILY  ?  ? Off-Protocol Failed - 09/06/2021 11:48 AM  ?  ?  Failed - Medication not assigned to a protocol, review manually.  ?  ?  Passed - Valid encounter within last 12 months  ?  Recent Outpatient Visits   ? ?      ? 4 months ago Type 2 diabetes mellitus with other specified complication, without long-term current use of insulin ([place])  ? [place], DO  ? 11 months ago Hyperlipidemia associated with type 2 diabetes mellitus ([place])  ? [place], DO  ? 1 year ago Benign hypertension with CKD (chronic kidney disease) stage III ([place])  ? [place], DO  ? 1 year ago Type 2 diabetes mellitus with other specified complication, without long-term current use of insulin ([place])  ? [place], DO  ? 1 year ago Type 2 diabetes mellitus with other specified complication, without long-term current use of insulin ([place])  ? [place], DO  ? ?  ?  ?Future Appointments   ? ?        ? In 1 month Karamalegos, Devonne Doughty, DO [facility], [place]  ? In 3 months Ralene Bathe, MD [place]  ? In 6 months Ralene Bathe, MD [place]  ? ?  ? ?  ?  ?  ?  ?

## 2021-10-06 ENCOUNTER — Other Ambulatory Visit: Payer: Self-pay

## 2021-10-06 DIAGNOSIS — N401 Enlarged prostate with lower urinary tract symptoms: Secondary | ICD-10-CM

## 2021-10-06 DIAGNOSIS — E041 Nontoxic single thyroid nodule: Secondary | ICD-10-CM

## 2021-10-06 DIAGNOSIS — E1169 Type 2 diabetes mellitus with other specified complication: Secondary | ICD-10-CM

## 2021-10-07 ENCOUNTER — Other Ambulatory Visit: Payer: Medicare Other

## 2021-10-07 DIAGNOSIS — E1169 Type 2 diabetes mellitus with other specified complication: Secondary | ICD-10-CM

## 2021-10-07 DIAGNOSIS — E041 Nontoxic single thyroid nodule: Secondary | ICD-10-CM

## 2021-10-07 DIAGNOSIS — N401 Enlarged prostate with lower urinary tract symptoms: Secondary | ICD-10-CM

## 2021-10-08 LAB — CBC WITH DIFFERENTIAL/PLATELET
Absolute Monocytes: 865 cells/uL (ref 200–950)
Basophils Absolute: 62 cells/uL (ref 0–200)
Basophils Relative: 0.6 %
Eosinophils Absolute: 134 cells/uL (ref 15–500)
Eosinophils Relative: 1.3 %
HCT: 48 % (ref 38.5–50.0)
Hemoglobin: 16 g/dL (ref 13.2–17.1)
Lymphs Abs: 1174 cells/uL (ref 850–3900)
MCH: 30.1 pg (ref 27.0–33.0)
MCHC: 33.3 g/dL (ref 32.0–36.0)
MCV: 90.4 fL (ref 80.0–100.0)
MPV: 10.8 fL (ref 7.5–12.5)
Monocytes Relative: 8.4 %
Neutro Abs: 8065 cells/uL — ABNORMAL HIGH (ref 1500–7800)
Neutrophils Relative %: 78.3 %
Platelets: 242 10*3/uL (ref 140–400)
RBC: 5.31 10*6/uL (ref 4.20–5.80)
RDW: 13.1 % (ref 11.0–15.0)
Total Lymphocyte: 11.4 %
WBC: 10.3 10*3/uL (ref 3.8–10.8)

## 2021-10-08 LAB — COMPREHENSIVE METABOLIC PANEL
AG Ratio: 1.8 (calc) (ref 1.0–2.5)
ALT: 22 U/L (ref 9–46)
AST: 21 U/L (ref 10–35)
Albumin: 4.2 g/dL (ref 3.6–5.1)
Alkaline phosphatase (APISO): 54 U/L (ref 35–144)
BUN/Creatinine Ratio: 22 (calc) (ref 6–22)
BUN: 29 mg/dL — ABNORMAL HIGH (ref 7–25)
CO2: 26 mmol/L (ref 20–32)
Calcium: 10 mg/dL (ref 8.6–10.3)
Chloride: 102 mmol/L (ref 98–110)
Creat: 1.32 mg/dL — ABNORMAL HIGH (ref 0.70–1.28)
Globulin: 2.4 g/dL (calc) (ref 1.9–3.7)
Glucose, Bld: 159 mg/dL — ABNORMAL HIGH (ref 65–99)
Potassium: 4.6 mmol/L (ref 3.5–5.3)
Sodium: 140 mmol/L (ref 135–146)
Total Bilirubin: 0.5 mg/dL (ref 0.2–1.2)
Total Protein: 6.6 g/dL (ref 6.1–8.1)

## 2021-10-08 LAB — LIPID PANEL
Cholesterol: 188 mg/dL (ref <200)
HDL: 69 mg/dL (ref >=40)
LDL Cholesterol (Calc): 92 mg/dL (calc)
Non-HDL Cholesterol (Calc): 119 mg/dL (calc) (ref <130)
Total CHOL/HDL Ratio: 2.7 (calc) (ref <5.0)
Triglycerides: 167 mg/dL — ABNORMAL HIGH (ref <150)

## 2021-10-08 LAB — PSA: PSA: 2.07 ng/mL (ref <=4.00)

## 2021-10-08 LAB — HEMOGLOBIN A1C
Hgb A1c MFr Bld: 7.4 % of total Hgb — ABNORMAL HIGH (ref <5.7)
Mean Plasma Glucose: 166 mg/dL
eAG (mmol/L): 9.2 mmol/L

## 2021-10-08 LAB — TSH: TSH: 2.07 mIU/L (ref 0.40–4.50)

## 2021-10-14 ENCOUNTER — Ambulatory Visit (INDEPENDENT_AMBULATORY_CARE_PROVIDER_SITE_OTHER): Payer: Medicare Other | Admitting: Family Medicine

## 2021-10-14 ENCOUNTER — Encounter: Payer: Self-pay | Admitting: Family Medicine

## 2021-10-14 VITALS — BP 118/60 | HR 76 | Ht 69.0 in | Wt 190.2 lb

## 2021-10-14 DIAGNOSIS — I129 Hypertensive chronic kidney disease with stage 1 through stage 4 chronic kidney disease, or unspecified chronic kidney disease: Secondary | ICD-10-CM | POA: Diagnosis not present

## 2021-10-14 DIAGNOSIS — E1165 Type 2 diabetes mellitus with hyperglycemia: Secondary | ICD-10-CM

## 2021-10-14 DIAGNOSIS — I700 Atherosclerosis of aorta: Secondary | ICD-10-CM

## 2021-10-14 DIAGNOSIS — E1169 Type 2 diabetes mellitus with other specified complication: Secondary | ICD-10-CM

## 2021-10-14 DIAGNOSIS — N183 Chronic kidney disease, stage 3 unspecified: Secondary | ICD-10-CM

## 2021-10-14 DIAGNOSIS — E785 Hyperlipidemia, unspecified: Secondary | ICD-10-CM

## 2021-10-14 MED ORDER — DEXCOM G7 SENSOR MISC: 5 refills | Status: DC

## 2021-10-14 MED ORDER — DEXCOM G7 RECEIVER DEVI: 5 refills | Status: DC

## 2021-10-14 NOTE — Patient Instructions (Addendum)
Thank you for coming to the office today. ? ?Keep up the good work. ? ?No major change on therapy today ? ?Reviewed lab results. ? ?The blurry vision may be from dehydration or blood sugar elevations / fluctuations ? ?Let me know if you need a referral. ? ?[facility] ?[place] [id number]  ?[place], [place] [address] ?Ph: [phone] ? ?Audiology ?[place], [place] ?[address], [address] ?[place], [place] [address] ?Phone: ?[phone] ? ? ?DUE for FASTING BLOOD WORK (no food or drink after midnight before the lab appointment, only water or coffee without cream/sugar on the morning of) ? ?SCHEDULE "Lab Only" visit in the morning at the clinic for lab draw in 6 MONTHS  ? ?- Make sure Lab Only appointment is at about 1 week before your next appointment, so that results will be available ? ?For Lab Results, once available within 2-3 days of blood draw, you can can log in to MyChart online to view your results and a brief explanation. Also, we can discuss results at next follow-up visit. ? ? ?Please schedule a Follow-up Appointment to: Return in about 6 months (around 04/15/2022) for 6 month fasting lab only then 1 week later Follow-up DM, [facility]. ? ?If you have any other questions or concerns, please feel free to call the office or send a message through [place]. You may also schedule an earlier appointment if necessary. ? ?Additionally, you may be receiving a survey about your experience at our office within a few days to 1 week by e-mail or mail. We value your feedback. ? ?Nobie Putnam, DO ?[place] ?

## 2021-10-14 NOTE — Progress Notes (Signed)
? ?Subjective:  ? ? Patient ID: Jorge Mayer, male    DOB: August 14, 1945, 76 y.o.   MRN: [id number] ? ?Jorge Mayer is a 76 y.o. male presenting on 10/14/2021 for Diabetes and Chronic Kidney Disease ? ? ?HPI ? ?BPH with LUTS ?Elevated PSA ?PSA normalized to 2.07, prior result 2.31, prior result 23.5 with UTI 1 year ago. Had seen Urologist. had seen Dr [facility] ?No significant BPH symptoms - only mild. ?  ?HYPERLIPIDEMIA: ?Hx CVA ?- Reports no concerns. Last lipid panel 10/2021 with some elevated LDL 90 range, inc from prior, also elevated TG ?Attributed to diet on cruise ?Continues Atorvastatin '10mg'$  daily ?  ?CHRONIC DM, Type 2: ?He is doing well ?A1c 7.4, stable from prior 7.2 to 7.3 ?CBGs: Still sees resting glucose in AM elevated. If active could be 170-180 after meal. ?Meds: Trijardy 25-5-'1000mg'$  daily (Jardiance 25 / Tradjenta 5 / metformin 1000 - combo pill) ?Reports good compliance. Tolerating well w/o side-effects ?Not on ACE ARB due to CKD per Renal - considering ACEi lisinopril ?Urine microalbumin due today ?He has history of cataracts ?History of CVA ?Denies hypoglycemia, polyuria, visual changes, numbness or tingling. ?  ?CHRONIC HTN with CKD-IIIa ?Last lab result 10/2021 showed Creatinine stable to 1.32 ?He will return to Nephrology as planned yearly. Dr Holley Raring [facility] ?Prior work up [employer], renal ultrasound, they were considering ACEi in near future. ?Current Meds - Not on any med   ?Denies CP, dyspnea, HA, edema, dizziness / lightheadedness ?  ?Former smoker ?1ppd for 10 years, quit 09/30/74 ?His brother died in 2020-07-01 to Jack, discovered at Stage IV. He has had 2 brothers passed with Lung Cancer age 99-70s ? ?GERD ?Esomeprazole '40mg'$  daily, had tried '20mg'$  previously. Doing well on 40 ?  ?BPH LUTS ?He has had some UTI in past, has LUTS ?Has seen Urology ?  ?Heart Murmur ?Similar to previous. asymptomatic ?  ?  ?Additional topics ?  ?Pulmonary Micronodules, LLL ?Continues on CT Lung  screening program ?He has fam history lung cancer, brother x 2 one smoker one not ? ?Thyroid nodule incidental ?Has had [place] and FNA biopsy, negative, only thyroid glandular tissue benign. ? ?  ?Atherosclerosis of Aorta ?Identified on CT imaging previously ?  ? ? ? ? ?  10/14/2021  ?  2:33 PM 04/15/2021  ?  9:09 AM 04/02/2020  ?  9:03 AM  ?Depression screen PHQ 2/9  ?Decreased Interest 0 0 0  ?Down, Depressed, Hopeless 0 0 0  ?PHQ - 2 Score 0 0 0  ?Altered sleeping 1 0   ?Tired, decreased energy 1 0   ?Change in appetite 0 0   ?Feeling bad or failure about yourself  0 0   ?Trouble concentrating 0 0   ?Moving slowly or fidgety/restless 0 0   ?Suicidal thoughts 0 0   ?PHQ-9 Score 2 0   ?Difficult doing work/chores Not difficult at all Not difficult at all   ? ? ?Social History  ? ?Tobacco Use  ? Smoking status: Former  ?  Packs/day: 1.00  ?  Years: 10.00  ?  Pack years: 10.00  ?  Types: Cigarettes  ?  Quit date: 07/06/1974  ?  Years since quitting: 47.3  ? Smokeless tobacco: Never  ?Substance Use Topics  ? Alcohol use: Yes  ?  Alcohol/week: 1.0 standard drink  ?  Types: 1 Standard drinks or equivalent per week  ? Drug use: Never  ? ? ?Review of Systems ?Per HPI unless  specifically indicated above ? ?   ?Objective:  ?  ?BP 118/60   Pulse 76   Ht '5\' 9"'$  (1.753 m)   Wt 190 lb 3.2 oz (86.3 kg)   SpO2 100%   BMI 28.09 kg/m?   ?Wt Readings from Last 3 Encounters:  ?10/14/21 190 lb 3.2 oz (86.3 kg)  ?06/02/21 192 lb 6.4 oz (87.3 kg)  ?04/15/21 189 lb 3.2 oz (85.8 kg)  ?  ?Physical Exam ?Vitals and nursing note reviewed.  ?Constitutional:   ?   General: He is not in acute distress. ?   Appearance: He is well-developed. He is not diaphoretic.  ?   Comments: Well-appearing, comfortable, cooperative  ?HENT:  ?   Head: Normocephalic and atraumatic.  ?Eyes:  ?   General:     ?   Right eye: No discharge.     ?   Left eye: No discharge.  ?   Conjunctiva/sclera: Conjunctivae normal.  ?Neck:  ?   Thyroid: No thyromegaly.   ?Cardiovascular:  ?   Rate and Rhythm: Normal rate and regular rhythm.  ?   Pulses: Normal pulses.  ?   Heart sounds: Normal heart sounds. No murmur heard. ?Pulmonary:  ?   Effort: Pulmonary effort is normal. No respiratory distress.  ?   Breath sounds: Normal breath sounds. No wheezing or rales.  ?Musculoskeletal:     ?   General: Normal range of motion.  ?   Cervical back: Normal range of motion and neck supple.  ?Lymphadenopathy:  ?   Cervical: No cervical adenopathy.  ?Skin: ?   General: Skin is warm and dry.  ?   Findings: No erythema or rash.  ?Neurological:  ?   Mental Status: He is alert and oriented to person, place, and time. Mental status is at baseline.  ?Psychiatric:     ?   Behavior: Behavior normal.  ?   Comments: Well groomed, good eye contact, normal speech and thoughts  ? ? ?I have personally reviewed the radiology report ? ?INDICATION: ?Indeterminate thyroid nodule ?  ?EXAM: ?ULTRASOUND GUIDED FINE NEEDLE ASPIRATION OF INDETERMINATE THYROID ?NODULE ?  ?COMPARISON:  Ultrasound thyroid exam dated 07/01/2021 ?  ?MEDICATIONS: ?2 mL 1% lidocaine ?  ?COMPLICATIONS: ?None immediate. ?  ?TECHNIQUE: ?Informed written consent was obtained from the patient after a ?discussion of the risks, benefits and alternatives to treatment. ?Questions regarding the procedure were encouraged and answered. A ?timeout was performed prior to the initiation of the procedure. ?  ?Pre-procedural ultrasound scanning demonstrated unchanged size and ?appearance of the indeterminate nodule within the right superior ?thyroid ?  ?The procedure was planned. The neck was prepped in the usual sterile ?fashion, and a sterile drape was applied covering the operative ?field. A timeout was performed prior to the initiation of the ?procedure. Local anesthesia was provided with 1% lidocaine. ?  ?Under direct ultrasound guidance, 5 FNA biopsies were performed of ?the right superior thyroid nodule with a 25 gauge needle. 2 of those ?were  obtained for [facility]. Multiple ultrasound images were saved for ?procedural documentation purposes. The samples were prepared and ?submitted to pathology. ?  ?Limited post procedural scanning was negative for hematoma or ?additional complication. Dressings were placed. The patient ?tolerated the above procedures procedure well without immediate ?postprocedural complication. ?  ?FINDINGS: ?Nodule reference number based on prior diagnostic ultrasound: 1 ?  ?Maximum size: 2.7 cm ?  ?Location: Right; Superior ?  ?ACR TI-RADS risk category: TR3 (3 points) ?  ?Reason for  biopsy: meets ACR TI-RADS criteria ?  ?Ultrasound imaging confirms appropriate placement of the needles ?within the thyroid nodule. ?  ?IMPRESSION: ?Technically successful ultrasound guided fine needle aspiration of ?right superior thyroid nodule. ?  ?Read by: Narda Rutherford, AGNP-BC ?  ?  ?Electronically Signed ?  By: Albin Felling M.D. ?  On: 08/14/2021 14:53 ? ?---- ? ?CLINICAL DATA:  Lung nodule.  Family history of lung cancer. ?  ?EXAM: ?CT CHEST WITHOUT CONTRAST ?  ?TECHNIQUE: ?Multidetector CT imaging of the chest was performed following the ?standard protocol without IV contrast. ?  ?COMPARISON:  01/08/2021 ?  ?FINDINGS: ?Cardiovascular: Extensive multi-vessel coronary artery ?calcification. Global cardiac size within normal limits. The central ?pulmonary arteries are of normal caliber. Moderate atherosclerotic ?calcifications seen within the thoracic aorta. No aortic aneurysm. ?  ?Mediastinum/Nodes: 2.4 cm right thyroid nodule is stable since prior ?examination, not well characterized on this exam. No pathologic ?thoracic adenopathy. The esophagus is unremarkable. Moderate hiatal ?hernia. ?  ?Lungs/Pleura: Clustered areas of a micronodularity within the ?superior segment of the left lower lobe have resolved, likely ?infectious or inflammatory on the prior examination. 4 mm pulmonary ?nodule within the right lower lobe, axial image # 101/3 and 5  mm ?pulmonary nodule within the left lower lobe, axial image # 94/3 are ?stable since prior examination. No new focal pulmonary nodules or ?infiltrates. No pneumothorax or pleural effusion. Central airways ?are widely patent. ?  ?Up

## 2021-10-15 ENCOUNTER — Other Ambulatory Visit: Payer: Self-pay | Admitting: Family Medicine

## 2021-10-15 DIAGNOSIS — E1165 Type 2 diabetes mellitus with hyperglycemia: Secondary | ICD-10-CM

## 2021-10-15 DIAGNOSIS — E1169 Type 2 diabetes mellitus with other specified complication: Secondary | ICD-10-CM

## 2021-10-15 NOTE — Assessment & Plan Note (Signed)
Well controlled T2DM A1c 7.4 currently - within range 7-8 or less ?Complications - CKDIII, GERD, cataracts ? ?Plan:  ?1. Continue current therapy - Trijardy 25-5-'1000mg'$  daily ?2. Encourage improved lifestyle - low carb, low sugar diet, reduce portion size, continue improving regular exercise ?3. Check CBG , bring log to next visit for review ?Low dose statin ?

## 2021-10-15 NOTE — Assessment & Plan Note (Signed)
Stable, seen on imaging ?On Statin therapy ?

## 2021-10-15 NOTE — Assessment & Plan Note (Signed)
LDL mild elevated from prior but < 100 currently ?The 10-year ASCVD risk score (Arnett DK, et al., 2019) is: 36.2%  ?Prior CVA ? ?Plan: ?1. Continue Atorvastatin '10mg'$  daily doing well without myalgia ?2. Remains off ASA ?3. Encourage improved lifestyle - low carb/cholesterol, reduce portion size, continue improving regular exercise ?

## 2021-10-15 NOTE — Assessment & Plan Note (Signed)
Well-controlled HTN ?Complication with CKD IIIa - Followed by Dr Holley Raring ?Last urine microalbumin was 0 (12/2019) - due today again ?  ?Plan:  ?1. No medications - is on SGLT2 currently ?2. Encourage improved lifestyle - low sodium diet, regular exercise ?3. Monitor BP outside office, bring readings to next visit, if persistently >140/90 or new symptoms notify office sooner ? ?He is off ACEi/ARB - would ask Nephrology if can reconsider therapy if indicated ?

## 2021-10-18 ENCOUNTER — Encounter: Payer: Self-pay | Admitting: Family Medicine

## 2021-10-18 DIAGNOSIS — E1165 Type 2 diabetes mellitus with hyperglycemia: Secondary | ICD-10-CM

## 2021-10-21 MED ORDER — DEXCOM G7 SENSOR MISC: 3 refills | Status: DC

## 2021-12-03 ENCOUNTER — Ambulatory Visit: Payer: 59 | Admitting: Pulmonary Disease

## 2021-12-08 ENCOUNTER — Ambulatory Visit (INDEPENDENT_AMBULATORY_CARE_PROVIDER_SITE_OTHER): Payer: Medicare Other | Admitting: Pulmonary Disease

## 2021-12-08 ENCOUNTER — Encounter: Payer: Self-pay | Admitting: Pulmonary Disease

## 2021-12-08 VITALS — BP 118/70 | HR 82 | Temp 97.5°F | Ht 69.0 in | Wt 191.0 lb

## 2021-12-08 DIAGNOSIS — R011 Cardiac murmur, unspecified: Secondary | ICD-10-CM

## 2021-12-08 DIAGNOSIS — K219 Gastro-esophageal reflux disease without esophagitis: Secondary | ICD-10-CM

## 2021-12-08 DIAGNOSIS — K449 Diaphragmatic hernia without obstruction or gangrene: Secondary | ICD-10-CM

## 2021-12-08 DIAGNOSIS — R918 Other nonspecific abnormal finding of lung field: Secondary | ICD-10-CM

## 2021-12-08 NOTE — Progress Notes (Signed)
Subjective:    Patient ID: Jorge Mayer, male    DOB: [birth year], 76 y.o.   MRN: [id number] Patient Care Team: Olin Hauser, DO as PCP - General [facility])  Chief Complaint  Patient presents with   Follow-up    CT pending. C/o prod cough(unsure of color) and post nasal drip x5d   HPI Jorge Mayer is a 76 year old former smoker (10 PY) who presents for follow-up of multiple lung nodules. Patient was transitioned from the [facility] to the [facility].  Initially evaluated here on 02 June 2021.  After that visit he had a CT chest on 10 June 2021 which showed clustered areas of micronodularity within the superior segment of the left lower lobe that had resolved likely infectious or inflammatory.  There was a 4 mm pulmonary nodule within the right lower lobe and a 5 mm pulmonary nodule within the left lower lobe.  The patient was to have CT chest 34-monthfollow-up prior to this visit.  Of note he has a hiatal hernia also noted and does have frequent episodes of gastroesophageal reflux with regurgitation into the back of his throat.  These episodes however have improved with the use of PPI and antireflux measures. [place]  He has not had this follow-up in a number of years.    The patient is currently asymptomatic with regards to the lung nodules.  He has had no dyspnea, fevers, chills or sweats.  No cough or sputum production.  No hemoptysis.  He has had some nasal symptoms over the last 5 days due to "allergy" with some clear postnasal drip and occasional cough productive of clear sputum.  This was worse 3 days ago and now improving.  He cannot use antihistamines due to prostate issues.  He does not endorse any other symptomatology.   [place]    Overall he feels well  and looks well.   Review of Systems A 10 point review of systems was performed and it is as noted above otherwise negative.  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 11/30/2019   History of SCC (squamous cell carcinoma) of skin 11/30/2019   AK (actinic keratosis) 11/30/2019   Stage 3a chronic kidney disease ([facility] 11/30/2019   Type 2 diabetes mellitus with other specified complication ([facility] 042/68/3419  Social History   Tobacco Use   Smoking status: Former    Packs/day: 1.00    Years: 10.00    Pack years: 10.00    Types: Cigarettes    Quit date: 07/06/1974    Years since quitting: 47.4   Smokeless tobacco: Never  Substance Use Topics   Alcohol use: Yes    Alcohol/week: 1.0 standard drink    Types: 1 Standard drinks or equivalent per week   No Known Allergies  Current Meds  Medication Sig   atorvastatin (LIPITOR) 10  MG tablet Take 1 tablet (10 mg total) by mouth at bedtime.   Cholecalciferol (D3-1000) 25 MCG (1000 UT) tablet Take 1,000 Units by mouth daily.   Continuous Blood Gluc Receiver (DEXCOM G7 RECEIVER) DEVI Use to check blood sugar   Continuous Blood Gluc Receiver (FREESTYLE LIBRE 2 READER) DEVI Use to check blood sugar   Continuous Blood Gluc Sensor (DEXCOM G7 SENSOR) MISC Use to check blood sugar. Change sensor every 10 days.   esomeprazole (NEXIUM) 40 MG capsule Take 1 capsule (40 mg total) by mouth daily.   Magnesium 400 MG TABS Take by mouth.   TRIJARDY XR 25-11-998 MG TB24 TAKE 1  TABLET DAILY   [DISCONTINUED] Specialty Vitamins Products (MAGNESIUM, AMINO ACID CHELATE,) 133 MG tablet Take 1 tablet by mouth daily. As per pt takes 400 mg  once daily.   Immunization History  Administered Date(s) Administered   Fluad Quad(high Dose 65+) 05/10/2020, 04/15/2021   Moderna Covid-19 Vaccine Bivalent Booster 18yr & up 06/10/2021   Moderna Sars-Covid-2 Vaccination 08/04/2019, 08/31/2019, 05/21/2020, 10/15/2020   Pneumococcal Conjugate-13 05/10/2020   Zoster Recombinat (Shingrix) 05/15/2020, 09/10/2020      Objective:   Physical Exam BP 118/70 (BP Location: Left Arm, Cuff Size: Normal)   Pulse 82   Temp (!) 97.5 F (36.4 C) (Temporal)   Ht '5\' 9"'$  (1.753 m)   Wt 191 lb (86.6 kg)   SpO2 97%   BMI 28.21 kg/m  GENERAL: Well-developed, well-nourished gentleman, no acute distress, fully ambulatory.  No conversational dyspnea. HEAD: Normocephalic, atraumatic.  EYES: Pupils equal, round, reactive to light.  No scleral icterus.  MOUTH: Nose/mouth/throat not examined due to masking requirements for COVID 19. NECK: Supple. No thyromegaly. Trachea midline. No JVD.  No adenopathy. PULMONARY: Good air entry bilaterally.  No adventitious sounds. CARDIOVASCULAR: S1 and S2. Regular rate and rhythm.  There is a grade 2/6 to 3/6 holosystolic murmur, harsh left sternal border. ABDOMEN: Benign. MUSCULOSKELETAL: No joint deformity, no clubbing, no edema.  NEUROLOGIC: No focal deficit, no gait disturbance, speech is fluent. SKIN: Intact,warm,dry. PSYCH: Mood and behavior normal.     Assessment & Plan:     ICD-10-CM   1. Multiple pulmonary nodules  R91.8 CT CHEST WO CONTRAST   Patient was supposed to have CT chest prior to visit Reorder CT chest    2. Cardiac murmur  R01.1 ECHOCARDIOGRAM COMPLETE   Not previously noted or as prominent Check 2D echo to evaluate     3. Hiatal hernia with GERD  K44.9    K21.9    Patient asymptomatic on PPI Following antireflux measures Query  aspiration bronchiolitis vis--vis lung nodules     Orders Placed This Encounter  Procedures   CT CHEST WO CONTRAST    Standing Status:   Future    Standing Expiration Date:   12/09/2022    Scheduling Instructions:     Next available.    Order Specific Question:   Preferred imaging location?    Answer:   [facility]   ECHOCARDIOGRAM COMPLETE    Standing Status:   Future    Standing Expiration Date:   06/09/2022    Order Specific Question:   Where should this test be performed    Answer:   [facility]    Order Specific Question:   Perflutren DEFINITY (image enhancing agent) should be administered unless hypersensitivity or allergy exist    Answer:   Administer Perflutren    Order Specific Question:   Reason for exam-Echo    Answer:  Murmur R01.1   Will go ahead and obtained the CT chest that should have been done prior to his visit.  Additionally we will get an echocardiogram given the patient's murmur noted today.  We will see him in follow-up in 6 months time he is to contact us prior to that time should any new difficulties arise.  We will inform him of the results of this test as these become available.  Renold Don, MD [facility]    *This note was dictated using voice recognition software/Dragon.  Despite best efforts to proofread, errors can occur which can change the meaning. Any transcriptional errors that result from this process are unintentional and may not be fully corrected at the time of dictation.

## 2021-12-08 NOTE — Patient Instructions (Signed)
We are going to go ahead and reschedule a chest CT.   We are ordering an echocardiogram that will tell us about your heart murmur.   We will see you in follow-up in 6 months time call sooner should any new problems arise.  We will let you know the results of your testing as it becomes available.

## 2021-12-12 ENCOUNTER — Ambulatory Visit
Admission: RE | Admit: 2021-12-12 | Discharge: 2021-12-12 | Disposition: A | Discharge status: Home / Self Care | Payer: Medicare Other | Source: Ambulatory Visit | Attending: Pulmonary Disease | Admitting: Pulmonary Disease

## 2021-12-12 DIAGNOSIS — I700 Atherosclerosis of aorta: Secondary | ICD-10-CM | POA: Diagnosis not present

## 2021-12-12 DIAGNOSIS — R918 Other nonspecific abnormal finding of lung field: Secondary | ICD-10-CM | POA: Diagnosis not present

## 2021-12-15 ENCOUNTER — Other Ambulatory Visit: Payer: Self-pay

## 2021-12-15 DIAGNOSIS — R918 Other nonspecific abnormal finding of lung field: Secondary | ICD-10-CM

## 2021-12-25 ENCOUNTER — Ambulatory Visit (INDEPENDENT_AMBULATORY_CARE_PROVIDER_SITE_OTHER): Payer: Medicare Other | Admitting: Dermatology

## 2021-12-25 ENCOUNTER — Encounter: Payer: Self-pay | Admitting: Dermatology

## 2021-12-25 DIAGNOSIS — L570 Actinic keratosis: Secondary | ICD-10-CM

## 2021-12-25 DIAGNOSIS — L578 Other skin changes due to chronic exposure to nonionizing radiation: Secondary | ICD-10-CM

## 2021-12-25 DIAGNOSIS — D229 Melanocytic nevi, unspecified: Secondary | ICD-10-CM | POA: Diagnosis not present

## 2021-12-25 DIAGNOSIS — L814 Other melanin hyperpigmentation: Secondary | ICD-10-CM

## 2021-12-25 DIAGNOSIS — D1800 Hemangioma unspecified site: Secondary | ICD-10-CM

## 2021-12-25 DIAGNOSIS — L820 Inflamed seborrheic keratosis: Secondary | ICD-10-CM

## 2021-12-25 DIAGNOSIS — L821 Other seborrheic keratosis: Secondary | ICD-10-CM

## 2021-12-25 DIAGNOSIS — Z86018 Personal history of other benign neoplasm: Secondary | ICD-10-CM

## 2021-12-25 DIAGNOSIS — Z1283 Encounter for screening for malignant neoplasm of skin: Secondary | ICD-10-CM | POA: Diagnosis not present

## 2021-12-25 NOTE — Progress Notes (Signed)
Follow-Up Visit   Subjective  Jorge Mayer is a 76 y.o. male who presents for the following: Total body skin exam (Hx of LUQA, hx of AKs) and check spots back (Back, wife noticed). The patient presents for Total-Body Skin Exam (TBSE) for skin cancer screening and mole check.  The patient has spots, moles and lesions to be evaluated, some may be new or changing and the patient has concerns that these could be cancer.   The following portions of the chart were reviewed this encounter and updated as appropriate:   Tobacco  Allergies  Meds  Problems  Med Hx  Surg Hx  Fam Hx     Review of Systems:  No other skin or systemic complaints except as noted in HPI or Assessment and Plan.  Objective  Well appearing patient in no apparent distress; mood and affect are within normal limits.  A full examination was performed including scalp, head, eyes, ears, nose, lips, neck, chest, axillae, abdomen, back, buttocks, bilateral upper extremities, bilateral lower extremities, hands, feet, fingers, toes, fingernails, and toenails. All findings within normal limits unless otherwise noted below.  scalp x 6 (6) Pink scaly macules  L cheek x 1 Stuck on waxy paps with erythema   Assessment & Plan   Lentigines - Scattered tan macules - Due to sun exposure - Benign-appearing, observe - Recommend daily broad spectrum sunscreen SPF 30+ to sun-exposed areas, reapply every 2 hours as needed. - Call for any changes - upper back  Seborrheic Keratoses - Stuck-on, waxy, tan-brown papules and/or plaques  - Benign-appearing - Discussed benign etiology and prognosis. - Observe - Call for any changes - back  Melanocytic Nevi - Tan-brown and/or pink-flesh-colored symmetric macules and papules - Benign appearing on exam today - Observation - Call clinic for new or changing moles - Recommend daily use of broad spectrum spf 30+ sunscreen to sun-exposed areas.  - trunk  Hemangiomas - Red  papules - Discussed benign nature - Observe - Call for any changes - trunk  Actinic Damage - Chronic condition, secondary to cumulative UV/sun exposure - diffuse scaly erythematous macules with underlying dyspigmentation - Recommend daily broad spectrum sunscreen SPF 30+ to sun-exposed areas, reapply every 2 hours as needed.  - Staying in the shade or wearing long sleeves, sun glasses (UVA+UVB protection) and wide brim hats (4-inch brim around the entire circumference of the hat) are also recommended for sun protection.  - Call for new or changing lesions.  Skin cancer screening performed today.  History of Dysplastic Nevi - No evidence of recurrence today - Recommend regular full body skin exams - Recommend daily broad spectrum sunscreen SPF 30+ to sun-exposed areas, reapply every 2 hours as needed.  - Call if any new or changing lesions are noted between office visits  - LUQA  AK (actinic keratosis) (6) scalp x 6  Destruction of lesion - scalp x 6 Complexity: simple   Destruction method: cryotherapy   Informed consent: discussed and consent obtained   Timeout:  patient name, date of birth, surgical site, and procedure verified Lesion destroyed using liquid nitrogen: Yes   Region frozen until ice ball extended beyond lesion: Yes   Outcome: patient tolerated procedure well with no complications   Post-procedure details: wound care instructions given    Inflamed seborrheic keratosis L cheek x 1 Symptomatic, irritating, patient would like treated.  Destruction of lesion - L cheek x 1 Complexity: simple   Destruction method: cryotherapy   Informed consent: discussed and  consent obtained   Timeout:  patient name, date of birth, surgical site, and procedure verified Lesion destroyed using liquid nitrogen: Yes   Region frozen until ice ball extended beyond lesion: Yes   Outcome: patient tolerated procedure well with no complications   Post-procedure details: wound care  instructions given    Return in about 1 year (around 12/26/2022) for TBSE, Hx of Dysplastic nevi, Hx of AKs.  I, Jorge Mayer, RMA, am acting as scribe for Jorge Ser, MD . Documentation: I have reviewed the above documentation for accuracy and completeness, and I agree with the above.  Jorge Ser, MD

## 2021-12-25 NOTE — Patient Instructions (Addendum)
Cryotherapy Aftercare  Wash gently with soap and water everyday.   Apply Vaseline and Band-Aid daily until healed.     Due to recent changes in healthcare laws, you may see results of your pathology and/or laboratory studies on MyChart before the doctors have had a chance to review them. We understand that in some cases there may be results that are confusing or concerning to you. Please understand that not all results are received at the same time and often the doctors may need to interpret multiple results in order to provide you with the best plan of care or course of treatment. Therefore, we ask that you please give us 2 business days to thoroughly review all your results before contacting the office for clarification. Should we see a critical lab result, you will be contacted sooner.   If You Need Anything After Your Visit  If you have any questions or concerns for your doctor, please call our main line at [phone] and press option 4 to reach your doctor's medical assistant. If no one answers, please leave a voicemail as directed and we will return your call as soon as possible. Messages left after 4 pm will be answered the following business day.   You may also send us a message via MyChart. We typically respond to MyChart messages within 1-2 business days.  For prescription refills, please ask your pharmacy to contact our office. Our fax number is [phone].  If you have an urgent issue when the clinic is closed that cannot wait until the next business day, you can page your doctor at the number below.    Please note that while we do our best to be available for urgent issues outside of office hours, we are not available 24/7.   If you have an urgent issue and are unable to reach us, you may choose to seek medical care at your doctor's office, retail clinic, urgent care center, or emergency room.  If you have a medical emergency, please immediately call 911 or go to the  emergency department.  Pager Numbers  - Dr. Kowalski: [phone]  - Dr. Moye: [phone]  - Dr. Stewart: [phone]  In the event of inclement weather, please call our main line at [phone] for an update on the status of any delays or closures.  Dermatology Medication Tips: Please keep the boxes that topical medications come in in order to help keep track of the instructions about where and how to use these. Pharmacies typically print the medication instructions only on the boxes and not directly on the medication tubes.   If your medication is too expensive, please contact our office at [phone] option 4 or send us a message through MyChart.   We are unable to tell what your co-pay for medications will be in advance as this is different depending on your insurance coverage. However, we may be able to find a substitute medication at lower cost or fill out paperwork to get insurance to cover a needed medication.   If a prior authorization is required to get your medication covered by your insurance company, please allow us 1-2 business days to complete this process.  Drug prices often vary depending on where the prescription is filled and some pharmacies may offer cheaper prices.  The website www.goodrx.com contains coupons for medications through different pharmacies. The prices here do not account for what the cost may be with help from insurance (it may be cheaper with your insurance), but the website can   give you the price if you did not use any insurance.  - You can print the associated coupon and take it with your prescription to the pharmacy.  - You may also stop by our office during regular business hours and pick up a GoodRx coupon card.  - If you need your prescription sent electronically to a different pharmacy, notify our office through [place] MyChart or by phone at [phone] option 4.     Si Usted Necesita Algo Despus de Su Visita  Tambin puede  enviarnos un mensaje a travs de MyChart. Por lo general respondemos a los mensajes de MyChart en el transcurso de 1 a 2 das hbiles.  Para renovar recetas, por favor pida a su farmacia que se ponga en contacto con nuestra oficina. Nuestro nmero de fax es el [phone].  Si tiene un asunto urgente cuando la clnica est cerrada y que no puede esperar hasta el siguiente da hbil, puede llamar/localizar a su doctor(a) al nmero que aparece a continuacin.   Por favor, tenga en cuenta que aunque hacemos todo lo posible para estar disponibles para asuntos urgentes fuera del horario de oficina, no estamos disponibles las 24 horas del da, los 7 das de la semana.   Si tiene un problema urgente y no puede comunicarse con nosotros, puede optar por buscar atencin mdica  en el consultorio de su doctor(a), en una clnica privada, en un centro de atencin urgente o en una sala de emergencias.  Si tiene una emergencia mdica, por favor llame inmediatamente al 911 o vaya a la sala de emergencias.  Nmeros de bper  - Dr. Kowalski: [phone]  - Dra. Moye: [phone]  - Dra. Stewart: [phone]  En caso de inclemencias del tiempo, por favor llame a nuestra lnea principal al [phone] para una actualizacin sobre el estado de cualquier retraso o cierre.  Consejos para la medicacin en dermatologa: Por favor, guarde las cajas en las que vienen los medicamentos de uso tpico para ayudarle a seguir las instrucciones sobre dnde y cmo usarlos. Las farmacias generalmente imprimen las instrucciones del medicamento slo en las cajas y no directamente en los tubos del medicamento.   Si su medicamento es muy caro, por favor, pngase en contacto con nuestra oficina llamando al [phone] y presione la opcin 4 o envenos un mensaje a travs de MyChart.   No podemos decirle cul ser su copago por los medicamentos por adelantado ya que esto es diferente dependiendo de la cobertura de su seguro.  Sin embargo, es posible que podamos encontrar un medicamento sustituto a menor costo o llenar un formulario para que el seguro cubra el medicamento que se considera necesario.   Si se requiere una autorizacin previa para que su compaa de seguros cubra su medicamento, por favor permtanos de 1 a 2 das hbiles para completar este proceso.  Los precios de los medicamentos varan con frecuencia dependiendo del lugar de dnde se surte la receta y alguna farmacias pueden ofrecer precios ms baratos.  El sitio web www.goodrx.com tiene cupones para medicamentos de diferentes farmacias. Los precios aqu no tienen en cuenta lo que podra costar con la ayuda del seguro (puede ser ms barato con su seguro), pero el sitio web puede darle el precio si no utiliz ningn seguro.  - Puede imprimir el cupn correspondiente y llevarlo con su receta a la farmacia.  - Tambin puede pasar por nuestra oficina durante el horario de atencin regular y recoger una tarjeta de cupones de GoodRx.  -   Si necesita que su receta se enve electrnicamente a una farmacia diferente, informe a nuestra oficina a travs de MyChart de [place] o por telfono llamando al [phone] y presione la opcin 4.  

## 2021-12-31 LAB — HM DIABETES EYE EXAM

## 2022-01-13 ENCOUNTER — Encounter: Payer: Self-pay | Admitting: Dermatology

## 2022-01-13 ENCOUNTER — Ambulatory Visit (INDEPENDENT_AMBULATORY_CARE_PROVIDER_SITE_OTHER): Payer: Medicare Other

## 2022-01-13 DIAGNOSIS — R011 Cardiac murmur, unspecified: Secondary | ICD-10-CM | POA: Diagnosis not present

## 2022-01-13 LAB — ECHOCARDIOGRAM COMPLETE
AR max vel: 1.58 cm2
AV Area VTI: 2.1 cm2
AV Area mean vel: 1.61 cm2
AV Mean grad: 6 mmHg
AV Peak grad: 11.2 mmHg
Ao pk vel: 1.67 m/s
Area-P 1/2: 3.36 cm2
P 1/2 time: 467 msec
S' Lateral: 2.5 cm
Single Plane A4C EF: 72 %

## 2022-02-16 ENCOUNTER — Ambulatory Visit: Payer: Medicare Other | Admitting: Pulmonary Disease

## 2022-02-18 ENCOUNTER — Other Ambulatory Visit: Payer: Self-pay | Admitting: Otolaryngology

## 2022-02-18 DIAGNOSIS — E041 Nontoxic single thyroid nodule: Secondary | ICD-10-CM

## 2022-03-12 ENCOUNTER — Ambulatory Visit: Payer: 59 | Admitting: Dermatology

## 2022-03-18 ENCOUNTER — Other Ambulatory Visit: Payer: Self-pay | Admitting: Family Medicine

## 2022-03-18 DIAGNOSIS — E1169 Type 2 diabetes mellitus with other specified complication: Secondary | ICD-10-CM

## 2022-03-19 NOTE — Telephone Encounter (Signed)
Requested medication (s) are due for refill today: yes  Requested medication (s) are on the active medication list: yes  Last refill:  09/08/21 #90 1 refills  Future visit scheduled: no  Notes to clinic:  medication not assigned to a protocol. Do you want to refill Rx?     Requested Prescriptions  Pending Prescriptions Disp Refills   TRIJARDY XR 25-11-998 MG TB24 [Pharmacy Med Name: TRIJARDYXR1G TAB 25-5 MG] 90 tablet 1    Sig: TAKE 1 TABLET DAILY     Off-Protocol Failed - 03/18/2022  1:29 PM      Failed - Medication not assigned to a protocol, review manually.      Passed - Valid encounter within last 12 months    Recent Outpatient Visits           5 months ago Type 2 diabetes mellitus with hyperglycemia, without long-term current use of insulin [facility])   [facility], Devonne Doughty, DO   11 months ago Type 2 diabetes mellitus with other specified complication, without long-term current use of insulin ([place])   [facility] Olin Hauser, DO   1 year ago Hyperlipidemia associated with type 2 diabetes mellitus [facility])   [facility] Olin Hauser, DO   1 year ago Benign hypertension with CKD (chronic kidney disease) stage III [facility])   [facility] Olin Hauser, DO   2 years ago Type 2 diabetes mellitus with other specified complication, without long-term current use of insulin ([place])   [facility] Olin Hauser, DO       Future Appointments             In 9 months Ralene Bathe, MD [place]

## 2022-04-05 ENCOUNTER — Other Ambulatory Visit: Payer: Self-pay | Admitting: Family Medicine

## 2022-04-05 DIAGNOSIS — I700 Atherosclerosis of aorta: Secondary | ICD-10-CM

## 2022-04-05 DIAGNOSIS — E1169 Type 2 diabetes mellitus with other specified complication: Secondary | ICD-10-CM

## 2022-04-06 NOTE — Telephone Encounter (Signed)
Refilled 05/22/2022 #90 3 rf. Requested Prescriptions  Pending Prescriptions Disp Refills  . atorvastatin (LIPITOR) 10 MG tablet [Pharmacy Med Name: ATORVASTATIN 10 MG TABLET] 90 tablet 3    Sig: TAKE 1 TABLET BY MOUTH EVERYDAY AT BEDTIME     Cardiovascular:  Antilipid - Statins Failed - 04/05/2022  9:10 AM      Failed - Lipid Panel in normal range within the last 12 months    Cholesterol  Date Value Ref Range Status  10/07/2021 188 <200 mg/dL Final   LDL Cholesterol (Calc)  Date Value Ref Range Status  10/07/2021 92 mg/dL (calc) Final    Comment:    Reference range: <100 . Desirable range <100 mg/dL for primary prevention;   <70 mg/dL for patients with CHD or diabetic patients  with > or = 2 CHD risk factors. Marland Kitchen LDL-C is now calculated using the Martin-Hopkins  calculation, which is a validated novel method providing  better accuracy than the Friedewald equation in the  estimation of LDL-C.  Cresenciano Genre et al. Annamaria Helling. 5916;384(66): 2061-2068  (http://education.[employer].com/faq/FAQ164)    HDL  Date Value Ref Range Status  10/07/2021 69 > OR = 40 mg/dL Final   Triglycerides  Date Value Ref Range Status  10/07/2021 167 (H) <150 mg/dL Final         Passed - Patient is not pregnant      Passed - Valid encounter within last 12 months    Recent Outpatient Visits          5 months ago Type 2 diabetes mellitus with hyperglycemia, without long-term current use of insulin [facility])   [facility], Devonne Doughty, DO   11 months ago Type 2 diabetes mellitus with other specified complication, without long-term current use of insulin ([place])   [facility], Devonne Doughty, DO   1 year ago Hyperlipidemia associated with type 2 diabetes mellitus [facility])   [facility], Devonne Doughty, DO   2 years ago Benign hypertension with CKD (chronic kidney disease) stage III [facility])   [facility],  Devonne Doughty, DO   2 years ago Type 2 diabetes mellitus with other specified complication, without long-term current use of insulin ([place])   [facility] Olin Hauser, DO      Future Appointments            In 8 months Ralene Bathe, MD [place]

## 2022-04-27 LAB — HM DIABETES EYE EXAM

## 2022-05-20 ENCOUNTER — Other Ambulatory Visit: Payer: Self-pay | Admitting: Pulmonary Disease

## 2022-06-10 ENCOUNTER — Other Ambulatory Visit: Payer: Self-pay | Admitting: Family Medicine

## 2022-06-10 DIAGNOSIS — I700 Atherosclerosis of aorta: Secondary | ICD-10-CM

## 2022-06-10 DIAGNOSIS — E1169 Type 2 diabetes mellitus with other specified complication: Secondary | ICD-10-CM

## 2022-06-10 NOTE — Telephone Encounter (Signed)
Requested Prescriptions  Pending Prescriptions Disp Refills   atorvastatin (LIPITOR) 10 MG tablet [Pharmacy Med Name: ATORVASTATIN 10 MG TABLET] 90 tablet 1    Sig: TAKE 1 TABLET BY MOUTH EVERYDAY AT BEDTIME     Cardiovascular:  Antilipid - Statins Failed - 06/10/2022  1:39 AM      Failed - Lipid Panel in normal range within the last 12 months    Cholesterol  Date Value Ref Range Status  10/07/2021 188 <200 mg/dL Final   LDL Cholesterol (Calc)  Date Value Ref Range Status  10/07/2021 92 mg/dL (calc) Final    Comment:    Reference range: <100 . Desirable range <100 mg/dL for primary prevention;   <70 mg/dL for patients with CHD or diabetic patients  with > or = 2 CHD risk factors. Marland Kitchen LDL-C is now calculated using the Martin-Hopkins  calculation, which is a validated novel method providing  better accuracy than the Friedewald equation in the  estimation of LDL-C.  Cresenciano Genre et al. Annamaria Helling. 7169;678(93): 2061-2068  (http://education.[employer].com/faq/FAQ164)    HDL  Date Value Ref Range Status  10/07/2021 69 > OR = 40 mg/dL Final   Triglycerides  Date Value Ref Range Status  10/07/2021 167 (H) <150 mg/dL Final         Passed - Patient is not pregnant      Passed - Valid encounter within last 12 months    Recent Outpatient Visits           7 months ago Type 2 diabetes mellitus with hyperglycemia, without long-term current use of insulin ([place])   [facility], Devonne Doughty, DO   1 year ago Type 2 diabetes mellitus with other specified complication, without long-term current use of insulin ([place])   [facility], Devonne Doughty, DO   1 year ago Hyperlipidemia associated with type 2 diabetes mellitus [facility])   [facility], Devonne Doughty, DO   2 years ago Benign hypertension with CKD (chronic kidney disease) stage III [facility])   [facility], Devonne Doughty, DO   2 years ago  Type 2 diabetes mellitus with other specified complication, without long-term current use of insulin ([place])   [facility] Olin Hauser, DO       Future Appointments             In 6 months Ralene Bathe, MD [address]

## 2022-07-06 HISTORY — PX: CATARACT EXTRACTION: SUR2

## 2022-07-22 ENCOUNTER — Ambulatory Visit (INDEPENDENT_AMBULATORY_CARE_PROVIDER_SITE_OTHER): Payer: Medicare Other

## 2022-07-22 DIAGNOSIS — Z23 Encounter for immunization: Secondary | ICD-10-CM

## 2022-07-29 ENCOUNTER — Other Ambulatory Visit: Payer: Self-pay

## 2022-07-29 ENCOUNTER — Ambulatory Visit (INDEPENDENT_AMBULATORY_CARE_PROVIDER_SITE_OTHER): Payer: Medicare Other | Admitting: Family Medicine

## 2022-07-29 ENCOUNTER — Encounter: Payer: Self-pay | Admitting: Family Medicine

## 2022-07-29 VITALS — BP 129/70 | HR 113 | Ht 69.0 in | Wt 187.6 lb

## 2022-07-29 DIAGNOSIS — R350 Frequency of micturition: Secondary | ICD-10-CM | POA: Diagnosis not present

## 2022-07-29 DIAGNOSIS — B3749 Other urogenital candidiasis: Secondary | ICD-10-CM | POA: Diagnosis not present

## 2022-07-29 DIAGNOSIS — T753XXA Motion sickness, initial encounter: Secondary | ICD-10-CM

## 2022-07-29 DIAGNOSIS — R81 Glycosuria: Secondary | ICD-10-CM | POA: Diagnosis not present

## 2022-07-29 DIAGNOSIS — N3001 Acute cystitis with hematuria: Secondary | ICD-10-CM

## 2022-07-29 LAB — POCT URINALYSIS DIPSTICK
Bilirubin, UA: NEGATIVE
Glucose, UA: POSITIVE — AB
Ketones, UA: NEGATIVE
Nitrite, UA: NEGATIVE
Protein, UA: NEGATIVE
Spec Grav, UA: 1.01 (ref 1.010–1.025)
Urobilinogen, UA: 0.2 E.U./dL
pH, UA: 6 (ref 5.0–8.0)

## 2022-07-29 MED ORDER — SULFAMETHOXAZOLE-TRIMETHOPRIM 800-160 MG PO TABS: 1.0000 | ORAL_TABLET | Freq: Two times a day (BID) | ORAL | 0 refills | Status: DC

## 2022-07-29 MED ORDER — SCOPOLAMINE 1 MG/3DAYS TD PT72: 1.0000 | MEDICATED_PATCH | TRANSDERMAL | 0 refills | Status: DC

## 2022-07-29 MED ORDER — FLUCONAZOLE 200 MG PO TABS: 200.0000 mg | ORAL_TABLET | Freq: Every day | ORAL | 0 refills | Status: DC

## 2022-07-29 NOTE — Patient Instructions (Addendum)
Thank you for coming to the office today.   Try the Scopalamine patch for motion sickness on cruise  The Trijardy is releasing sugar as intended into the urine, that increases your risk of bacterial and fungal infections of the urine.   1. You have a Urinary Tract Infection - this is very common, your symptoms are reassuring and you should get better within 1 week on the antibiotics - Start Bactrim Septra 2 times daily for next 7 days, complete entire course, even if feeling better - We sent urine for a culture, we will call you within next few days if we need to change antibiotics - Please drink plenty of fluids, improve hydration over next 1 week  If symptoms worsening, developing nausea / vomiting, worsening back pain, fevers / chills / sweats, then please return for re-evaluation sooner.  If you take AZO OTC - limit this to 2-3 days MAX to avoid affecting kidneys  D-Mannose is a natural supplement that can actually help bind to urinary bacteria and reduce their effectiveness it can help prevent UTI from forming, and may reduce some symptoms. It likely cannot cure an active UTI but it is worth a try and good to prevent them with. Try '500mg'$  twice a day at a full dose if you want, or check package instructions for more info   Dermatofibroma of the leg. Likely was a spider bite or something similar that injured that spot. And it left a scar tissue is darkened.  I recommend topical Cortisone to help treat it and reduce the darkening of skin and nodular density.  It may not go away 100%   Please schedule a Follow-up Appointment to: Return if symptoms worsen or fail to improve.  If you have any other questions or concerns, please feel free to call the office or send a message through [place]. You may also schedule an earlier appointment if necessary.  Additionally, you may be receiving a survey about your experience at our office within a few days to 1 week by e-mail or mail. We value  your feedback.  Nobie Putnam, DO [place]

## 2022-07-29 NOTE — Progress Notes (Signed)
Subjective:    Patient ID: Jorge Mayer, male    DOB: 1945/10/03, 77 y.o.   MRN: [id number]  Jorge Mayer is a 77 y.o. male presenting on 07/29/2022 for Urinary Frequency  Patient presents for a same day appointment.  HPI  UTI Type 2 diabetes He is followed by Urology Last UTI 12/2020 w staph UTI treated with Septra, and it resolved it after 7 day course  Recent travel to to [facility] with using different water pressure and says cleaning genital area he may have had some water that traveled up his urethra  He is on Trijardy SGLT2 for years.  He admits now recent 1+ week with dysuria urinary frequency discomfort. Asking about early symptoms of UTI  He took cranberry supplement.  Additionally He had issue with 2 weeks ago hypoglycemia episode near syncope  He uses CGM Dexcom G7 and he is really improving his glucose management able to maintain his readings better and can adjust accordingly. He is aware of what can spike or trigger his glucose.  Upcoming [place] cruise in 1 month. Asks about seasick motion sickness  Left leg posterior lesion He reports possible spider bite or other small injury, has had an abnormal skin spot there and some sore with nodule but not resolving.      10/14/2021    2:33 PM 04/15/2021    9:09 AM 04/02/2020    9:03 AM  Depression screen PHQ 2/9  Decreased Interest 0 0 0  Down, Depressed, Hopeless 0 0 0  PHQ - 2 Score 0 0 0  Altered sleeping 1 0   Tired, decreased energy 1 0   Change in appetite 0 0   Feeling bad or failure about yourself  0 0   Trouble concentrating 0 0   Moving slowly or fidgety/restless 0 0   Suicidal thoughts 0 0   PHQ-9 Score 2 0   Difficult doing work/chores Not difficult at all Not difficult at all     Social History   Tobacco Use   Smoking status: Former    Packs/day: 1.00    Years: 10.00    Total pack years: 10.00    Types: Cigarettes    Quit date: 07/06/1974    Years since quitting: 48.0    Smokeless tobacco: Never  Substance Use Topics   Alcohol use: Yes    Alcohol/week: 1.0 standard drink of alcohol    Types: 1 Standard drinks or equivalent per week   Drug use: Never    Review of Systems Per HPI unless specifically indicated above     Objective:    BP 129/70   Pulse (!) 113   Ht '5\' 9"'$  (1.753 m)   Wt 187 lb 9.6 oz (85.1 kg)   SpO2 99%   BMI 27.70 kg/m   Wt Readings from Last 3 Encounters:  07/29/22 187 lb 9.6 oz (85.1 kg)  12/08/21 191 lb (86.6 kg)  10/14/21 190 lb 3.2 oz (86.3 kg)    Physical Exam Vitals and nursing note reviewed.  Constitutional:      General: He is not in acute distress.    Appearance: Normal appearance. He is well-developed. He is not diaphoretic.     Comments: Well-appearing, comfortable, cooperative  HENT:     Head: Normocephalic and atraumatic.  Eyes:     General:        Right eye: No discharge.        Left eye: No discharge.     Conjunctiva/sclera:  Conjunctivae normal.  Cardiovascular:     Rate and Rhythm: Normal rate.  Pulmonary:     Effort: Pulmonary effort is normal.  Skin:    General: Skin is warm and dry.     Findings: Lesion (Left leg position 1 x 1 cm pigmented pearly nodular dermatofibroma on exam.) present. No erythema or rash.  Neurological:     Mental Status: He is alert and oriented to person, place, and time.  Psychiatric:        Mood and Affect: Mood normal.        Behavior: Behavior normal.        Thought Content: Thought content normal.     Comments: Well groomed, good eye contact, normal speech and thoughts    Results for orders placed or performed in visit on 07/29/22  POCT Urinalysis Dipstick  Result Value Ref Range   Color, UA Yellow    Clarity, UA Cloudy    Glucose, UA Positive (A) Negative   Bilirubin, UA Negative    Ketones, UA Negative    Spec Grav, UA 1.010 1.010 - 1.025   Blood, UA Trace    pH, UA 6.0 5.0 - 8.0   Protein, UA Negative Negative   Urobilinogen, UA 0.2 0.2 or 1.0 E.U./dL    Nitrite, UA Negative    Leukocytes, UA Trace (A) Negative   Appearance     Odor        Assessment & Plan:   Problem List Items Addressed This Visit   None Visit Diagnoses     Acute cystitis with hematuria    -  Primary   Relevant Medications   sulfamethoxazole-trimethoprim (BACTRIM DS) 800-160 MG tablet   Urinary frequency       Relevant Orders   POCT Urinalysis Dipstick (Completed)   Urine Culture   Glucosuria       Yeast UTI       Relevant Medications   sulfamethoxazole-trimethoprim (BACTRIM DS) 800-160 MG tablet   fluconazole (DIFLUCAN) 200 MG tablet   Motion sickness, initial encounter       Relevant Medications   scopolamine (TRANSDERM-SCOP) 1 MG/3DAYS       Clinically consistent with UTI and confirmed on UA. Last UTI 12/2020, treated by Urology on Bactrim, reviewed culture. 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  Plan: 1. Urinalysis with trace leuks blood. +++glucose 2. Ordered Urine culture 3. Bactrim DS TWICE A DAY x 7 days, same as previous - Also add Fluconazole '200mg'$  daily x 1-2 weeks if not improving will cover for mycotic UTI course, or can use if secondary to antibacterial therapy 4. Improve PO hydration - Add D-Mannose + Cranberry supplement for UTI prevention if interested  May return here or to Urology if need Consider adjusting Trijardy or avoiding SGLT2 in future if having difficulty with recurrence.   #Motion Sickness Upcoming cruise in 2 weeks Order rx Scopolamine patches PRN  Meds ordered this encounter  Medications   sulfamethoxazole-trimethoprim (BACTRIM DS) 800-160 MG tablet    Sig: Take 1 tablet by mouth 2 (two) times daily.    Dispense:  14 tablet    Refill:  0   fluconazole (DIFLUCAN) 200 MG tablet    Sig: Take 1 tablet (200 mg total)  by mouth daily. For Yeast UTI. For up  to 2 weeks. May stop after 1 week if resolved.    Dispense:  14 tablet    Refill:  0   scopolamine (TRANSDERM-SCOP) 1 MG/3DAYS    Sig: Place 1 patch (1.5 mg total) onto the skin every 3 (three) days. For motion sickness. Start 2 hr before onset symptoms, up to 12 hr before    Dispense:  4 patch    Refill:  0      Follow up plan: Return if symptoms worsen or fail to improve.   Nobie Putnam, [facility] 07/29/2022, 3:17 PM

## 2022-07-31 LAB — URINE CULTURE
MICRO NUMBER:: 14468281
SPECIMEN QUALITY:: ADEQUATE

## 2022-08-10 ENCOUNTER — Other Ambulatory Visit: Payer: 59

## 2022-08-10 ENCOUNTER — Ambulatory Visit
Admission: RE | Admit: 2022-08-10 | Discharge: 2022-08-10 | Disposition: A | Discharge status: Home / Self Care | Payer: Medicare Other | Source: Ambulatory Visit | Attending: Otolaryngology | Admitting: Otolaryngology

## 2022-08-10 DIAGNOSIS — E041 Nontoxic single thyroid nodule: Secondary | ICD-10-CM

## 2022-09-04 ENCOUNTER — Other Ambulatory Visit: Payer: Self-pay | Admitting: Family Medicine

## 2022-09-04 DIAGNOSIS — E1169 Type 2 diabetes mellitus with other specified complication: Secondary | ICD-10-CM

## 2022-09-07 NOTE — Telephone Encounter (Signed)
Requested Prescriptions  Pending Prescriptions Disp Refills   TRIJARDY XR 25-11-998 MG TB24 [Pharmacy Med Name: TRIJARDYXR1G TAB 25-5 MG] 90 tablet 0    Sig: Take 1 tablet by mouth daily. OFFICE VISIT NEEDED FOR ADDITIONAL REFILLS     Off-Protocol Failed - 09/04/2022 11:19 PM      Failed - Medication not assigned to a protocol, review manually.      Passed - Valid encounter within last 12 months    Recent Outpatient Visits           1 month ago Acute cystitis with hematuria   [facility], Devonne Doughty, DO   10 months ago Type 2 diabetes mellitus with hyperglycemia, without long-term current use of insulin [facility])   [place], DO   1 year ago Type 2 diabetes mellitus with other specified complication, without long-term current use of insulin [facility])   [place], DO   1 year ago Hyperlipidemia associated with type 2 diabetes mellitus [facility])   [place], DO   2 years ago Benign hypertension with CKD (chronic kidney disease) stage III [facility])   [facility] Olin Hauser, DO       Future Appointments             In 3 months Ralene Bathe, MD [place]

## 2022-10-08 IMAGING — CT CT CHEST W/O CM
1 series · 15 of 34 positions shown, 19 images · non-contrast
Comparison: 01/08/2021

CLINICAL DATA: Lung nodule.  Family history of lung cancer.

EXAM:
CT CHEST WITHOUT CONTRAST
TECHNIQUE: Multidetector CT imaging of the chest was performed following the
standard protocol without IV contrast.

[Series 2: thorax · axial · 0.71mm/px · z∈[-825,-543]mm · 15 of 167 slices shown, 19 images]
[im 13/167  mediastinal]
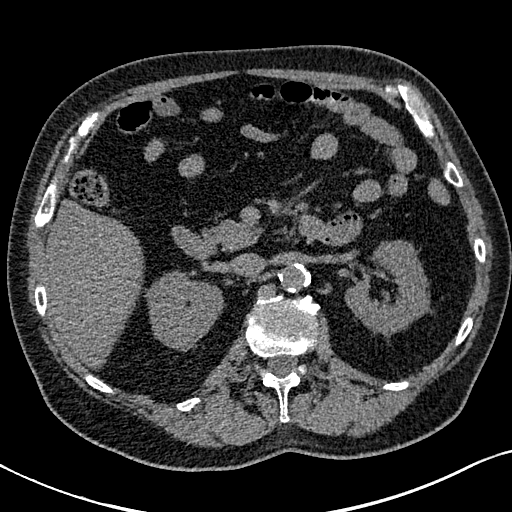
[im 13/167  lung]
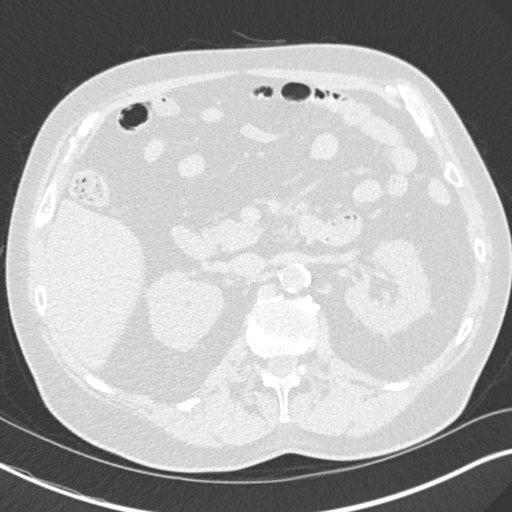
[im 25/167  lung]
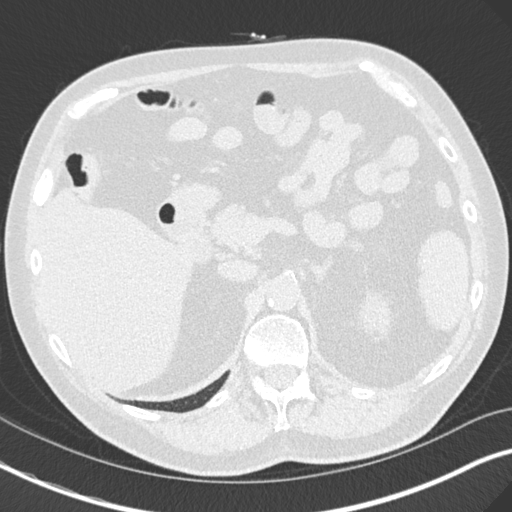
[im 34/167  lung]
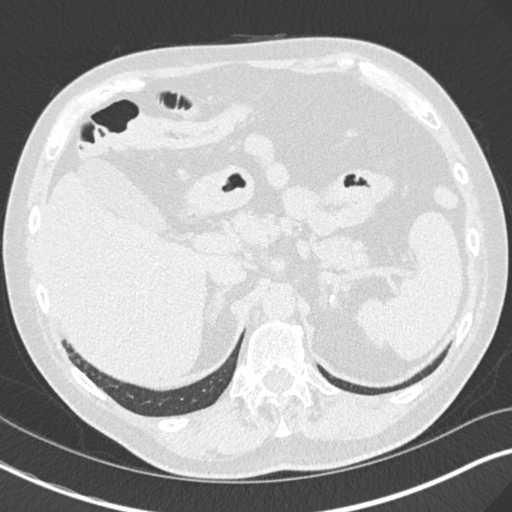
[im 44/167  lung]
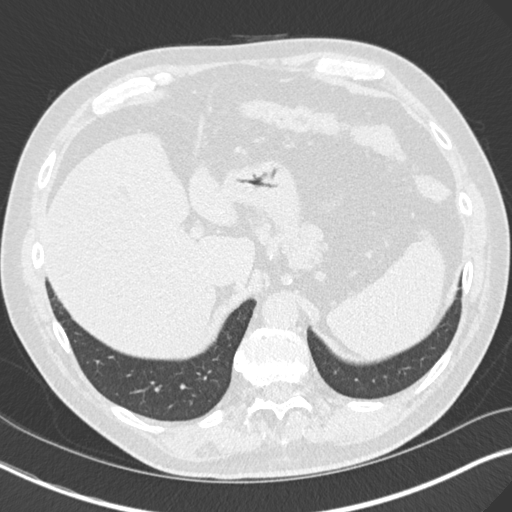
[im 56/167  mediastinal]
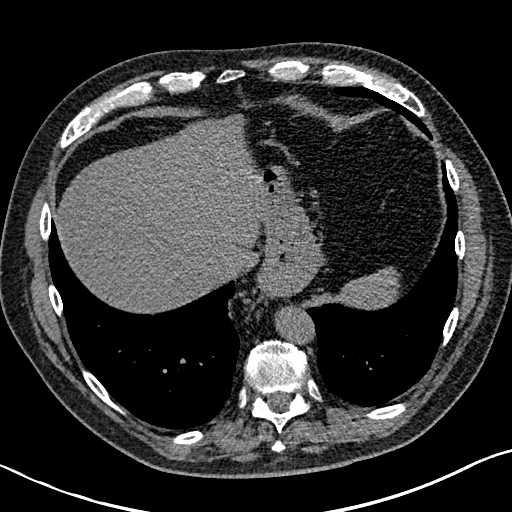
[im 56/167  lung]
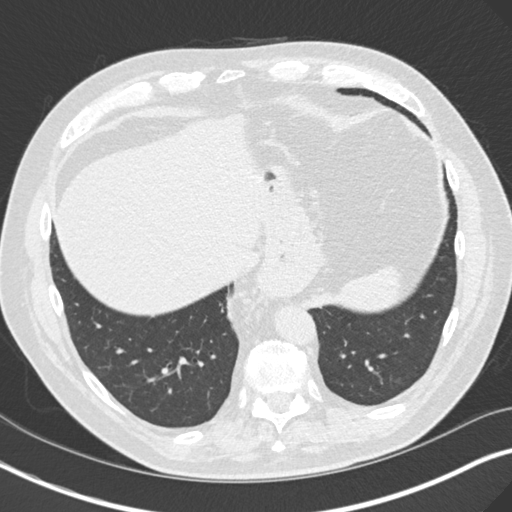
[im 67/167  lung]
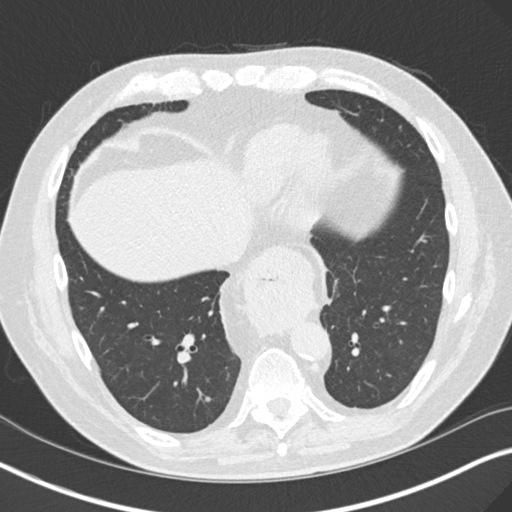
[im 74/167  lung]
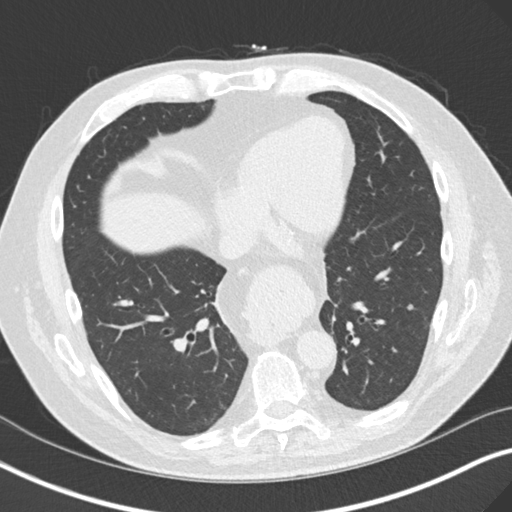
[im 87/167  lung]
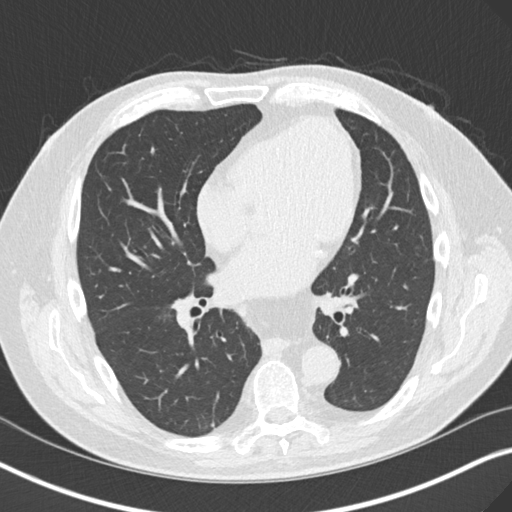
[im 93/167  mediastinal]
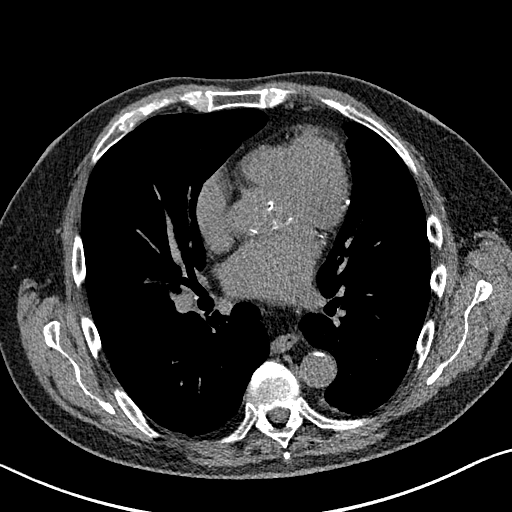
[im 93/167  lung]
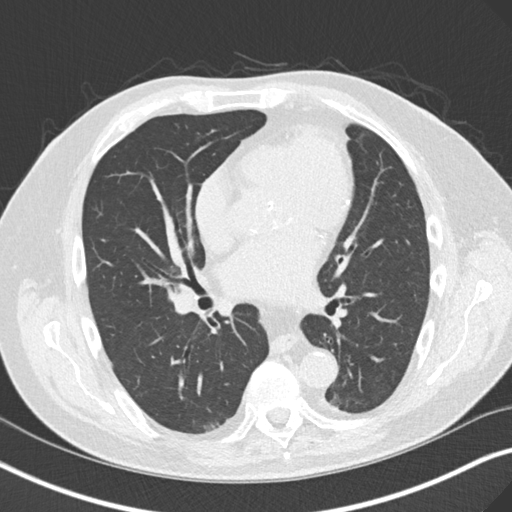
[im 100/167  lung]
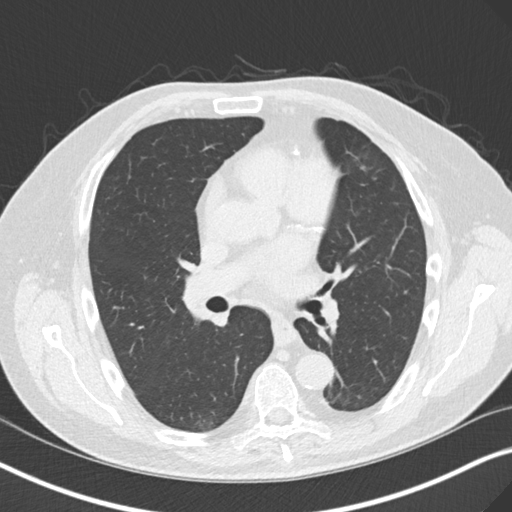
[im 111/167  lung]
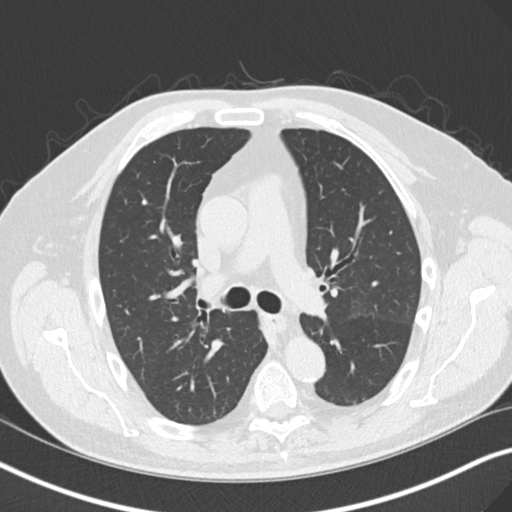
[im 123/167  lung]
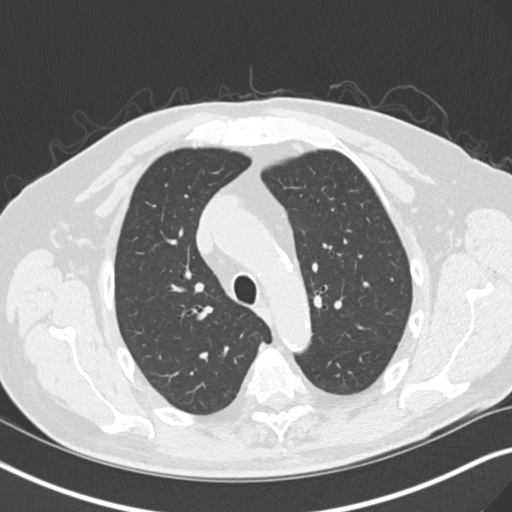
[im 133/167  mediastinal]
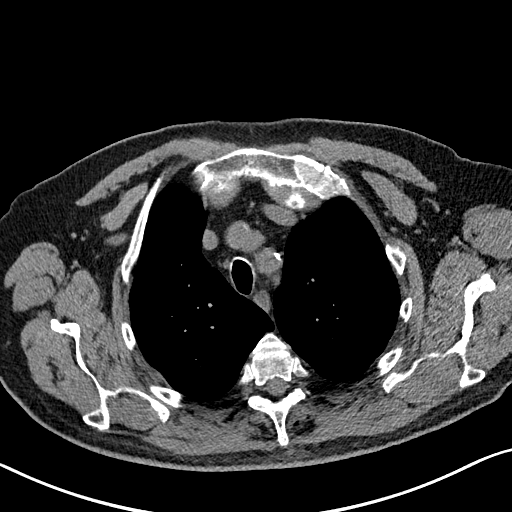
[im 133/167  lung]
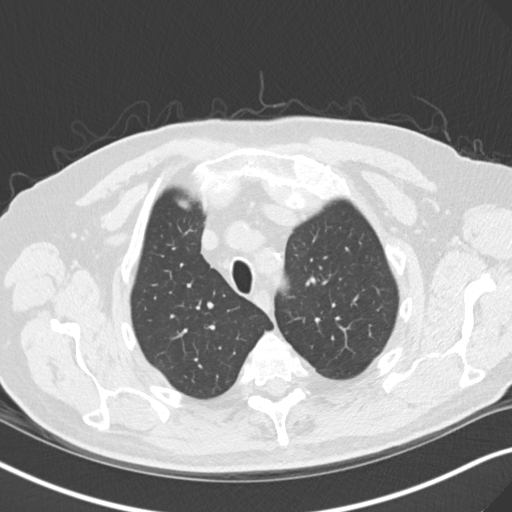
[im 142/167  lung]
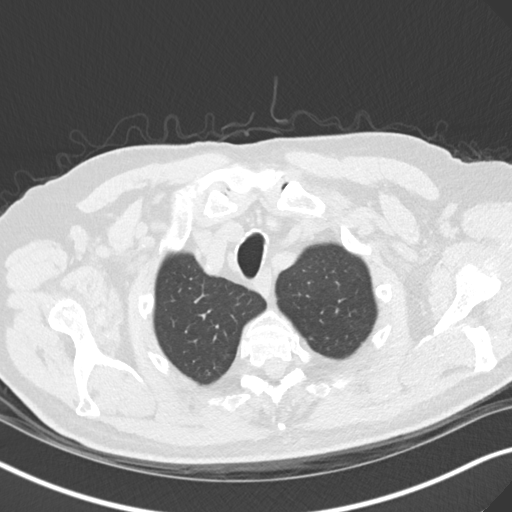
[im 154/167  lung]
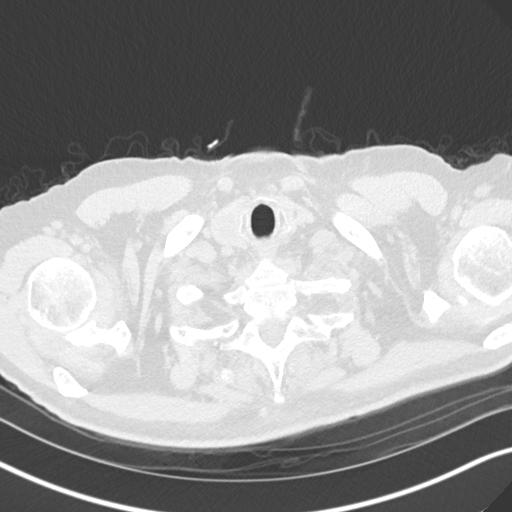

[15 of 34 positions shown; findings below may reference images not displayed]

FINDINGS: Cardiovascular: Extensive multi-vessel coronary artery
calcification. Global cardiac size within normal limits. The central
pulmonary arteries are of normal caliber. Moderate atherosclerotic
calcifications seen within the thoracic aorta. No aortic aneurysm.

Mediastinum/Nodes: 2.4 cm right thyroid nodule is stable since prior
examination, not well characterized on this exam. No pathologic
thoracic adenopathy. The esophagus is unremarkable. Moderate hiatal
hernia.

Lungs/Pleura: Clustered areas of a micronodularity within the
superior segment of the left lower lobe have resolved, likely
infectious or inflammatory on the prior examination. 4 mm pulmonary
nodule within the right lower lobe, axial image # 101/3 and 5 mm
pulmonary nodule within the left lower lobe, axial image # 94/3 are
stable since prior examination. No new focal pulmonary nodules or
infiltrates. No pneumothorax or pleural effusion. Central airways
are widely patent.

Upper Abdomen: No acute abnormality.

Musculoskeletal: No acute bone abnormality. No lytic or blastic bone
lesion are identified.
IMPRESSION: Stable pulmonary nodules measuring up to 5 mm within the left lower
lobe. No follow-up needed if patient is low-risk. Non-contrast chest
CT can be considered in 6 months (12 months from the original study)
if patient is high-risk. This recommendation follows the consensus
statement: Guidelines for Management of Incidental Pulmonary Nodules
Detected on CT Images: From the [HOSPITAL] 9748; Radiology
9748; [DATE].

Extensive coronary artery calcification.

2.4 cm right thyroid nodule, not well characterized on this
examination. Recommend thyroid US (ref: [HOSPITAL]. 9720

Aortic Atherosclerosis ([id number]).

## 2022-11-19 ENCOUNTER — Other Ambulatory Visit: Payer: Self-pay | Admitting: Family Medicine

## 2022-11-19 DIAGNOSIS — I700 Atherosclerosis of aorta: Secondary | ICD-10-CM

## 2022-11-19 DIAGNOSIS — E1169 Type 2 diabetes mellitus with other specified complication: Secondary | ICD-10-CM

## 2022-11-19 NOTE — Telephone Encounter (Signed)
Unable to refill per protocol, Rx request is too soon. Last refill 06/10/22 for 90 and 1 refill.  Requested Prescriptions  Pending Prescriptions Disp Refills   atorvastatin (LIPITOR) 10 MG tablet [Pharmacy Med Name: ATORVASTATIN 10 MG TABLET] 90 tablet 1    Sig: TAKE 1 TABLET BY MOUTH EVERYDAY AT BEDTIME     Cardiovascular:  Antilipid - Statins Failed - 11/19/2022 10:30 AM      Failed - Lipid Panel in normal range within the last 12 months    Cholesterol  Date Value Ref Range Status  10/07/2021 188 <200 mg/dL Final   LDL Cholesterol (Calc)  Date Value Ref Range Status  10/07/2021 92 mg/dL (calc) Final    Comment:    Reference range: <100 . Desirable range <100 mg/dL for primary prevention;   <70 mg/dL for patients with CHD or diabetic patients  with > or = 2 CHD risk factors. Marland Kitchen LDL-C is now calculated using the Martin-Hopkins  calculation, which is a validated novel method providing  better accuracy than the Friedewald equation in the  estimation of LDL-C.  Horald Pollen et al. Lenox Ahr. 4098;119(14): 2061-2068  (http://education.[facility].com/faq/FAQ164)    HDL  Date Value Ref Range Status  10/07/2021 69 > OR = 40 mg/dL Final   Triglycerides  Date Value Ref Range Status  10/07/2021 167 (H) <150 mg/dL Final         Passed - Patient is not pregnant      Passed - Valid encounter within last 12 months    Recent Outpatient Visits           3 months ago Acute cystitis with hematuria   [place] [facility] [place], Netta Neat, DO   1 year ago Type 2 diabetes mellitus with hyperglycemia, without long-term current use of insulin [facility])   [place] [facility] [address], Netta Neat, DO   1 year ago Type 2 diabetes mellitus with other specified complication, without long-term current use of insulin [facility])   [place] [facility] [place], Netta Neat, DO   2 years ago Hyperlipidemia associated with type 2  diabetes mellitus [facility])   [place] [facility] [place], Netta Neat, DO   2 years ago Benign hypertension with CKD (chronic kidney disease) stage III [facility])   [place] [facility] Smitty Cords, DO       Future Appointments             In 1 month Gwen Pounds Dineen Kid, MD [facility]

## 2022-12-03 ENCOUNTER — Other Ambulatory Visit: Payer: Self-pay

## 2022-12-03 DIAGNOSIS — R918 Other nonspecific abnormal finding of lung field: Secondary | ICD-10-CM

## 2022-12-18 ENCOUNTER — Other Ambulatory Visit: Payer: Self-pay | Admitting: Family Medicine

## 2022-12-18 DIAGNOSIS — E1169 Type 2 diabetes mellitus with other specified complication: Secondary | ICD-10-CM

## 2022-12-18 DIAGNOSIS — I700 Atherosclerosis of aorta: Secondary | ICD-10-CM

## 2022-12-18 NOTE — Telephone Encounter (Signed)
Requested Prescriptions  Pending Prescriptions Disp Refills   atorvastatin (LIPITOR) 10 MG tablet [Pharmacy Med Name: ATORVASTATIN 10 MG TABLET] 90 tablet 0    Sig: TAKE 1 TABLET BY MOUTH EVERYDAY AT BEDTIME     Cardiovascular:  Antilipid - Statins Failed - 12/18/2022  2:22 AM      Failed - Lipid Panel in normal range within the last 12 months    Cholesterol  Date Value Ref Range Status  10/07/2021 188 <200 mg/dL Final   LDL Cholesterol (Calc)  Date Value Ref Range Status  10/07/2021 92 mg/dL (calc) Final    Comment:    Reference range: <100 . Desirable range <100 mg/dL for primary prevention;   <70 mg/dL for patients with CHD or diabetic patients  with > or = 2 CHD risk factors. Marland Kitchen LDL-C is now calculated using the Martin-Hopkins  calculation, which is a validated novel method providing  better accuracy than the Friedewald equation in the  estimation of LDL-C.  Horald Pollen et al. Lenox Ahr. 1610;960(45): 2061-2068  (http://education.[facility].com/faq/FAQ164)    HDL  Date Value Ref Range Status  10/07/2021 69 > OR = 40 mg/dL Final   Triglycerides  Date Value Ref Range Status  10/07/2021 167 (H) <150 mg/dL Final         Passed - Patient is not pregnant      Passed - Valid encounter within last 12 months    Recent Outpatient Visits           4 months ago Acute cystitis with hematuria   [place] [facility] [place], Netta Neat, DO   1 year ago Type 2 diabetes mellitus with hyperglycemia, without long-term current use of insulin [facility])   [place] [facility] [place], Netta Neat, DO   1 year ago Type 2 diabetes mellitus with other specified complication, without long-term current use of insulin [facility])   [place] [facility] [place], Netta Neat, DO   2 years ago Hyperlipidemia associated with type 2 diabetes mellitus [facility])   [place] [facility] [place], Netta Neat, DO   2 years  ago Benign hypertension with CKD (chronic kidney disease) stage III [facility])   [place] [facility] [place], Netta Neat, DO       Future Appointments             In 1 week Deirdre Evener, MD [facility]

## 2022-12-19 ENCOUNTER — Other Ambulatory Visit: Payer: Self-pay | Admitting: Family Medicine

## 2022-12-19 DIAGNOSIS — E1169 Type 2 diabetes mellitus with other specified complication: Secondary | ICD-10-CM

## 2022-12-20 ENCOUNTER — Encounter: Payer: Self-pay | Admitting: Family Medicine

## 2022-12-20 DIAGNOSIS — E1165 Type 2 diabetes mellitus with hyperglycemia: Secondary | ICD-10-CM

## 2022-12-21 MED ORDER — DEXCOM G7 SENSOR MISC: 0 refills | Status: DC

## 2022-12-21 NOTE — Telephone Encounter (Signed)
Requested medication (s) are due for refill today: yes  Requested medication (s) are on the active medication list: yes  Last refill:  09/07/22  Future visit scheduled: no  Notes to clinic:   Medication not assigned to a protocol, review manually.      Requested Prescriptions  Pending Prescriptions Disp Refills   TRIJARDY XR [id number] MG TB24 [Pharmacy Med Name: TRIJARDYXR1G TAB 25-5 MG] 90 tablet 0    Sig: TAKE 1 TABLET DAILY     Off-Protocol Failed - 12/19/2022 11:42 AM      Failed - Medication not assigned to a protocol, review manually.      Passed - Valid encounter within last 12 months    Recent Outpatient Visits           4 months ago Acute cystitis with hematuria   [place] [facility] [employer], Netta Neat, DO   1 year ago Type 2 diabetes mellitus with hyperglycemia, without long-term current use of insulin [facility])   [place] [facility] [place], Netta Neat, DO   1 year ago Type 2 diabetes mellitus with other specified complication, without long-term current use of insulin [facility])   [place] [facility] [place], Netta Neat, DO   2 years ago Hyperlipidemia associated with type 2 diabetes mellitus [facility])   [place] [facility] [place], Netta Neat, DO   2 years ago Benign hypertension with CKD (chronic kidney disease) stage III [facility])    [facility] Smitty Cords, DO       Future Appointments             In 1 week Deirdre Evener, MD [place] [facility]

## 2022-12-23 ENCOUNTER — Ambulatory Visit
Admission: RE | Admit: 2022-12-23 | Discharge: 2022-12-23 | Disposition: A | Discharge status: Home / Self Care | Payer: Medicare Other | Source: Ambulatory Visit | Attending: Family Medicine | Admitting: Family Medicine

## 2022-12-23 DIAGNOSIS — R918 Other nonspecific abnormal finding of lung field: Secondary | ICD-10-CM | POA: Insufficient documentation

## 2022-12-30 ENCOUNTER — Ambulatory Visit (INDEPENDENT_AMBULATORY_CARE_PROVIDER_SITE_OTHER): Payer: Medicare Other | Admitting: Dermatology

## 2022-12-30 VITALS — BP 128/70 | HR 65

## 2022-12-30 DIAGNOSIS — W908XXA Exposure to other nonionizing radiation, initial encounter: Secondary | ICD-10-CM | POA: Diagnosis not present

## 2022-12-30 DIAGNOSIS — Z1283 Encounter for screening for malignant neoplasm of skin: Secondary | ICD-10-CM | POA: Diagnosis not present

## 2022-12-30 DIAGNOSIS — Z86018 Personal history of other benign neoplasm: Secondary | ICD-10-CM

## 2022-12-30 DIAGNOSIS — D239 Other benign neoplasm of skin, unspecified: Secondary | ICD-10-CM

## 2022-12-30 DIAGNOSIS — L820 Inflamed seborrheic keratosis: Secondary | ICD-10-CM

## 2022-12-30 DIAGNOSIS — Z7189 Other specified counseling: Secondary | ICD-10-CM

## 2022-12-30 DIAGNOSIS — D2372 Other benign neoplasm of skin of left lower limb, including hip: Secondary | ICD-10-CM

## 2022-12-30 DIAGNOSIS — L821 Other seborrheic keratosis: Secondary | ICD-10-CM

## 2022-12-30 DIAGNOSIS — L570 Actinic keratosis: Secondary | ICD-10-CM | POA: Diagnosis not present

## 2022-12-30 DIAGNOSIS — D229 Melanocytic nevi, unspecified: Secondary | ICD-10-CM

## 2022-12-30 DIAGNOSIS — Z85828 Personal history of other malignant neoplasm of skin: Secondary | ICD-10-CM

## 2022-12-30 DIAGNOSIS — L578 Other skin changes due to chronic exposure to nonionizing radiation: Secondary | ICD-10-CM

## 2022-12-30 DIAGNOSIS — D1801 Hemangioma of skin and subcutaneous tissue: Secondary | ICD-10-CM

## 2022-12-30 DIAGNOSIS — L814 Other melanin hyperpigmentation: Secondary | ICD-10-CM

## 2022-12-30 DIAGNOSIS — Z8589 Personal history of malignant neoplasm of other organs and systems: Secondary | ICD-10-CM

## 2022-12-30 DIAGNOSIS — Z872 Personal history of diseases of the skin and subcutaneous tissue: Secondary | ICD-10-CM

## 2022-12-30 DIAGNOSIS — Z79899 Other long term (current) drug therapy: Secondary | ICD-10-CM

## 2022-12-30 DIAGNOSIS — Z5111 Encounter for antineoplastic chemotherapy: Secondary | ICD-10-CM

## 2022-12-30 NOTE — Patient Instructions (Addendum)
Start cream treatment in 1 month   - Start 5-fluorouracil/calcipotriene cream twice a day for 10 days to affected areas including entire scalp. Prescription sent to [facility]. Patient advised they will receive an email to purchase the medication online and have it sent to their home. Patient provided with handout reviewing treatment course and side effects and advised to call or message [place] on MyChart with any concerns.  Reviewed course of treatment and expected reaction.  Patient advised to expect inflammation and crusting and advised that erosions are possible.  Patient advised to be diligent with sun protection during and after treatment. Counseled to keep medication out of reach of children and pets.  Instructions for [facility]  One or more of your medications was sent to the [facility] order [facility]. You will receive an email from them and can purchase the medicine through that link. It will then be mailed to your home at the address you confirmed. If for any reason you do not receive an email from them, please check your spam folder. If you still do not find the email, please let us know. [facility] phone number is [phone].     5-Fluorouracil/Calcipotriene Patient Education   Actinic keratoses are the dry, red scaly spots on the skin caused by sun damage. A portion of these spots can turn into skin cancer with time, and treating them can help prevent development of skin cancer.   Treatment of these spots requires removal of the defective skin cells. There are various ways to remove actinic keratoses, including freezing with liquid nitrogen, treatment with creams, or treatment with a blue light procedure in the office.   5-fluorouracil cream is a topical cream used to treat actinic keratoses. It works by interfering with the growth of abnormal fast-growing skin cells, such as actinic keratoses. These cells peel off  and are replaced by healthy ones.   5-fluorouracil/calcipotriene is a combination of the 5-fluorouracil cream with a vitamin D analog cream called calcipotriene. The calcipotriene alone does not treat actinic keratoses. However, when it is combined with 5-fluorouracil, it helps the 5-fluorouracil treat the actinic keratoses much faster so that the same results can be achieved with a much shorter treatment time.  INSTRUCTIONS FOR 5-FLUOROURACIL/CALCIPOTRIENE CREAM:   5-fluorouracil/calcipotriene cream typically only needs to be used for 4-7 days. A thin layer should be applied twice a day to the treatment areas recommended by your physician.   If your physician prescribed you separate tubes of 5-fluourouracil and calcipotriene, apply a thin layer of 5-fluorouracil followed by a thin layer of calcipotriene.   Avoid contact with your eyes, nostrils, and mouth. Do not use 5-fluorouracil/calcipotriene cream on infected or open wounds.   You will develop redness, irritation and some crusting at areas where you have pre-cancer damage/actinic keratoses. IF YOU DEVELOP PAIN, BLEEDING, OR SIGNIFICANT CRUSTING, STOP THE TREATMENT EARLY - you have already gotten a good response and the actinic keratoses should clear up well.  Wash your hands after applying 5-fluorouracil 5% cream on your skin.   A moisturizer or sunscreen with a minimum SPF 30 should be applied each morning.   Once you have finished the treatment, you can apply a thin layer of Vaseline twice a day to irritated areas to soothe and calm the areas more quickly. If you experience significant discomfort, contact your physician.  For some patients it is necessary to repeat the treatment for best results.  SIDE EFFECTS: When using 5-fluorouracil/calcipotriene cream,  you may have mild irritation, such as redness, dryness, swelling, or a mild burning sensation. This usually resolves within 2 weeks. The more actinic keratoses you have, the more  redness and inflammation you can expect during treatment. Eye irritation has been reported rarely. If this occurs, please let us know.  If you have any trouble using this cream, please call the office. If you have any other questions about this information, please do not hesitate to ask me before you leave the office.     Actinic keratoses are precancerous spots that appear secondary to cumulative UV radiation exposure/sun exposure over time. They are chronic with expected duration over 1 year. A portion of actinic keratoses will progress to squamous cell carcinoma of the skin. It is not possible to reliably predict which spots will progress to skin cancer and so treatment is recommended to prevent development of skin cancer.  Recommend daily broad spectrum sunscreen SPF 30+ to sun-exposed areas, reapply every 2 hours as needed.  Recommend staying in the shade or wearing long sleeves, sun glasses (UVA+UVB protection) and wide brim hats (4-inch brim around the entire circumference of the hat). Call for new or changing lesions.    Cryotherapy Aftercare  Wash gently with soap and water everyday.   Apply Vaseline and Band-Aid daily until healed.   Seborrheic Keratosis  What causes seborrheic keratoses? Seborrheic keratoses are harmless, common skin growths that first appear during adult life.  As time goes by, more growths appear.  Some people may develop a large number of them.  Seborrheic keratoses appear on both covered and uncovered body parts.  They are not caused by sunlight.  The tendency to develop seborrheic keratoses can be inherited.  They vary in color from skin-colored to gray, brown, or even black.  They can be either smooth or have a rough, warty surface.   Seborrheic keratoses are superficial and look as if they were stuck on the skin.  Under the microscope this type of keratosis looks like layers upon layers of skin.  That is why at times the top layer may seem to fall off, but the  rest of the growth remains and re-grows.    Treatment Seborrheic keratoses do not need to be treated, but can easily be removed in the office.  Seborrheic keratoses often cause symptoms when they rub on clothing or jewelry.  Lesions can be in the way of shaving.  If they become inflamed, they can cause itching, soreness, or burning.  Removal of a seborrheic keratosis can be accomplished by freezing, burning, or surgery. If any spot bleeds, scabs, or grows rapidly, please return to have it checked, as these can be an indication of a skin cancer.      Melanoma ABCDEs  Melanoma is the most dangerous type of skin cancer, and is the leading cause of death from skin disease.  You are more likely to develop melanoma if you: Have light-colored skin, light-colored eyes, or red or blond hair Spend a lot of time in the sun Tan regularly, either outdoors or in a tanning bed Have had blistering sunburns, especially during childhood Have a close family member who has had a melanoma Have atypical moles or large birthmarks  Early detection of melanoma is key since treatment is typically straightforward and cure rates are extremely high if we catch it early.   The first sign of melanoma is often a change in a mole or a new dark spot.  The ABCDE system is a  way of remembering the signs of melanoma.  A for asymmetry:  The two halves do not match. B for border:  The edges of the growth are irregular. C for color:  A mixture of colors are present instead of an even brown color. D for diameter:  Melanomas are usually (but not always) greater than 6mm - the size of a pencil eraser. E for evolution:  The spot keeps changing in size, shape, and color.  Please check your skin once per month between visits. You can use a small mirror in front and a large mirror behind you to keep an eye on the back side or your body.   If you see any new or changing lesions before your next follow-up, please call to schedule a  visit.  Please continue daily skin protection including broad spectrum sunscreen SPF 30+ to sun-exposed areas, reapplying every 2 hours as needed when you're outdoors.   Staying in the shade or wearing long sleeves, sun glasses (UVA+UVB protection) and wide brim hats (4-inch brim around the entire circumference of the hat) are also recommended for sun protection.    Due to recent changes in healthcare laws, you may see results of your pathology and/or laboratory studies on MyChart before the doctors have had a chance to review them. We understand that in some cases there may be results that are confusing or concerning to you. Please understand that not all results are received at the same time and often the doctors may need to interpret multiple results in order to provide you with the best plan of care or course of treatment. Therefore, we ask that you please give [place] 2 business days to thoroughly review all your results before contacting the office for clarification. Should we see a critical lab result, you will be contacted sooner.   If You Need Anything After Your Visit  If you have any questions or concerns for your doctor, please call our main line at [phone] and press option 4 to reach your doctor's medical assistant. If no one answers, please leave a voicemail as directed and we will return your call as soon as possible. Messages left after 4 pm will be answered the following business day.   You may also send [place] a message via MyChart. We typically respond to MyChart messages within 1-2 business days.  For prescription refills, please ask your pharmacy to contact our office. Our fax number is [phone].  If you have an urgent issue when the clinic is closed that cannot wait until the next business day, you can page your doctor at the number below.    Please note that while we do our best to be available for urgent issues outside of office hours, we are not available 24/7.   If you  have an urgent issue and are unable to reach [place], you may choose to seek medical care at your doctor's office, retail clinic, urgent care center, or emergency room.  If you have a medical emergency, please immediately call 911 or go to the emergency department.  Pager Numbers  - Dr. Gwen Pounds: [phone]  - Dr. Neale Burly: [phone]  - Dr. Roseanne Reno: [phone]  In the event of inclement weather, please call our main line at [phone] for an update on the status of any delays or closures.  Dermatology Medication Tips: Please keep the boxes that topical medications come in in order to help keep track of the instructions about where and how to use these. Pharmacies typically print  the medication instructions only on the boxes and not directly on the medication tubes.   If your medication is too expensive, please contact our office at [phone] option 4 or send [place] a message through MyChart.   We are unable to tell what your co-pay for medications will be in advance as this is different depending on your insurance coverage. However, we may be able to find a substitute medication at lower cost or fill out paperwork to get insurance to cover a needed medication.   If a prior authorization is required to get your medication covered by your insurance company, please allow [place] 1-2 business days to complete this process.  Drug prices often vary depending on where the prescription is filled and some pharmacies may offer cheaper prices.  The website www.goodrx.com contains coupons for medications through different pharmacies. The prices here do not account for what the cost may be with help from insurance (it may be cheaper with your insurance), but the website can give you the price if you did not use any insurance.  - You can print the associated coupon and take it with your prescription to the pharmacy.  - You may also stop by our office during regular business hours and pick up a GoodRx coupon  card.  - If you need your prescription sent electronically to a different pharmacy, notify our office through [facility] or by phone at [phone] option 4.     Si Usted Necesita Algo Despus de Su Visita  Tambin puede enviarnos un mensaje a travs de Clinical cytogeneticist. Por lo general respondemos a los mensajes de MyChart en el transcurso de 1 a 2 das hbiles.  Para renovar recetas, por favor pida a su farmacia que se ponga en contacto con nuestra oficina. Annie Sable de fax es [place] [phone].  Si tiene un asunto urgente cuando la clnica est cerrada y que no puede esperar hasta el siguiente da hbil, puede llamar/localizar a su doctor(a) al nmero que aparece a continuacin.   Por favor, tenga en cuenta que aunque hacemos todo lo posible para estar disponibles para asuntos urgentes fuera del horario de [place], no estamos disponibles las 24 horas del da, los 7 [address]  [address] de la [place].   Si tiene un problema urgente y no puede comunicarse con nosotros, puede optar por buscar atencin mdica  en el consultorio de su doctor(a), en una clnica privada, en un centro de atencin urgente o en una sala de emergencias.  Si tiene Engineer, drilling, por favor llame inmediatamente al 911 o vaya a la sala de emergencias.  Nmeros de bper  - Dr. Gwen Pounds: [phone]  - Dra. Moye: [phone]  - Dra. Roseanne Reno: [phone]  En caso de inclemencias del [place], por favor llame a Lacy Duverney principal al [phone] para una actualizacin sobre el [place] de cualquier retraso o cierre.  Consejos para la medicacin en dermatologa: Por favor, guarde las cajas en las que vienen los medicamentos de uso tpico para ayudarle a seguir las instrucciones sobre dnde y cmo usarlos. Las farmacias generalmente imprimen las instrucciones del medicamento slo en las cajas y no directamente en los tubos del [place].   Si su medicamento es muy caro, por favor, pngase en contacto con Rolm Gala llamando al [phone] y presione la opcin 4 o envenos un mensaje a travs de Clinical cytogeneticist.   No podemos decirle cul ser su copago por los medicamentos por adelantado ya que esto es diferente dependiendo de la cobertura de su seguro.  Sin embargo, es posible que podamos encontrar un medicamento sustituto a Electrical engineer un formulario para que el seguro cubra el medicamento que se considera necesario.   Si se requiere una autorizacin previa para que su compaa de seguros [place] su medicamento, por favor permtanos de 1 a 2 das hbiles para completar este proceso.  Los precios de los medicamentos varan con frecuencia dependiendo del Environmental consultant de dnde se surte la receta y alguna farmacias pueden ofrecer precios ms baratos.  El sitio web www.goodrx.com tiene cupones para medicamentos de Airline pilot. Los precios aqu no tienen en cuenta lo que podra costar con la ayuda del seguro (puede ser ms barato con su seguro), pero el sitio web puede darle el precio si no utiliz Research scientist (physical sciences).  - Puede imprimir el cupn correspondiente y llevarlo con su receta a la farmacia.  - Tambin puede pasar por nuestra oficina durante el horario de atencin regular y Charity fundraiser una tarjeta de cupones de GoodRx.  - Si necesita que su receta se enve electrnicamente a una farmacia diferente, informe a nuestra oficina a travs de MyChart de [place] o por telfono llamando al [phone] y presione la opcin 4.

## 2022-12-30 NOTE — Progress Notes (Signed)
Follow-Up Visit   Subjective  Jorge Mayer is a 77 y.o. male who presents for the following: Skin Cancer Screening and Full Body Skin Exam hx of dysplastic hx of aks, hx of scc   The patient presents for Total-Body Skin Exam (TBSE) for skin cancer screening and mole check. The patient has spots, moles and lesions to be evaluated, some may be new or changing and the patient has concerns that these could be cancer.    The following portions of the chart were reviewed this encounter and updated as appropriate: medications, allergies, medical history  Review of Systems:  No other skin or systemic complaints except as noted in HPI or Assessment and Plan.  Objective  Well appearing patient in no apparent distress; mood and affect are within normal limits.  A full examination was performed including scalp, head, eyes, ears, nose, lips, neck, chest, axillae, abdomen, back, buttocks, bilateral upper extremities, bilateral lower extremities, hands, feet, fingers, toes, fingernails, and toenails. All findings within normal limits unless otherwise noted below.   Relevant physical exam findings are noted in the Assessment and Plan.  scalp x 8, right temple x 3, forehead x 2, right cheek x 3, left cheek x 2 (18) Erythematous thin papules/macules with gritty scale.   left sideburn temple x 1 Erythematous stuck-on, waxy papule or plaque    Assessment & Plan   DERMATOFIBROMA At left posterior thigh  Exam: Firm pink/brown papulenodule with dimple sign. Treatment Plan: A dermatofibroma is a benign growth possibly related to trauma, such as an insect bite, cut from shaving, or inflamed acne-type bump.  Treatment options to remove include shave or excision with resulting scar and risk of recurrence.  Since benign-appearing and not bothersome, will observe for now.    LENTIGINES, SEBORRHEIC KERATOSES, HEMANGIOMAS - Benign normal skin lesions - Benign-appearing - Call for any  changes  MELANOCYTIC NEVI - Tan-brown and/or pink-flesh-colored symmetric macules and papules - Benign appearing on exam today - Observation - Call clinic for new or changing moles - Recommend daily use of broad spectrum spf 30+ sunscreen to sun-exposed areas.   ACTINIC DAMAGE WITH PRECANCEROUS ACTINIC KERATOSES Counseling for Topical Chemotherapy Management: Patient exhibits: - Severe, confluent actinic changes with pre-cancerous actinic keratoses that is secondary to cumulative UV radiation exposure over time - Condition that is severe; chronic, not at goal. - diffuse scaly erythematous macules and papules with underlying dyspigmentation - Discussed Prescription "Field Treatment" topical Chemotherapy for Severe, Chronic Confluent Actinic Changes with Pre-Cancerous Actinic Keratoses Field treatment involves treatment of an entire area of skin that has confluent Actinic Changes (Sun/ Ultraviolet light damage) and PreCancerous Actinic Keratoses by method of PhotoDynamic Therapy (PDT) and/or prescription Topical Chemotherapy agents such as 5-fluorouracil, 5-fluorouracil/calcipotriene, and/or imiquimod.  The purpose is to decrease the number of clinically evident and subclinical PreCancerous lesions to prevent progression to development of skin cancer by chemically destroying early precancer changes that may or may not be visible.  It has been shown to reduce the risk of developing skin cancer in the treated area. As a result of treatment, redness, scaling, crusting, and open sores may occur during treatment course. One or more than one of these methods may be used and may have to be used several times to control, suppress and eliminate the PreCancerous changes. Discussed treatment course, expected reaction, and possible side effects. - Recommend daily broad spectrum sunscreen SPF 30+ to sun-exposed areas, reapply every 2 hours as needed.  - Staying in the shade or  wearing long sleeves, sun glasses  (UVA+UVB protection) and wide brim hats (4-inch brim around the entire circumference of the hat) are also recommended. - Call for new or changing lesions.  Start in 1 month  - Start 5-fluorouracil/calcipotriene cream twice a day for 10 days to affected areas including entire scalp. Prescription sent to [facility]. Patient advised they will receive an email to purchase the medication online and have it sent to their home. Patient provided with handout reviewing treatment course and side effects and advised to call or message [place] on MyChart with any concerns.  Reviewed course of treatment and expected reaction.  Patient advised to expect inflammation and crusting and advised that erosions are possible.  Patient advised to be diligent with sun protection during and after treatment. Counseled to keep medication out of reach of children and pets.   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  - Call if any new or changing lesions are noted between office visits  SKIN CANCER SCREENING PERFORMED TODAY.    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 Prescription sent to [facility]. Patient advised they will  receive an email to purchase the medication online and have it sent to their home. Patient provided with handout reviewing treatment course and side effects and advised to call or message [place] on MyChart with any concerns.  Reviewed course of treatment and expected reaction.  Patient advised to expect inflammation and crusting and advised that erosions are possible.  Patient advised to be diligent with sun protection during and after treatment. Counseled to keep medication out of reach of children and pets.     Actinic keratoses are precancerous spots that appear secondary to cumulative UV radiation exposure/sun exposure over time. They are chronic with expected duration over 1 year. A portion of actinic keratoses will progress to squamous cell carcinoma of the skin. It is not possible to reliably predict which spots will progress to skin cancer and so treatment is recommended to prevent development of skin cancer.  Recommend daily broad spectrum sunscreen SPF 30+ to sun-exposed areas, reapply every 2 hours as needed.  Recommend staying in the shade or wearing long sleeves, sun glasses (UVA+UVB protection) and wide brim hats (4-inch brim around the entire circumference of the hat). Call for new or changing lesions.  Destruction of lesion - scalp x 8, right temple x 3, forehead x 2, right cheek x 3, left cheek x 2  Inflamed seborrheic keratosis left sideburn temple x 1  Symptomatic, irritating, patient would like treated.  Destruction of lesion - left sideburn temple x 1 Complexity: simple   Destruction method: cryotherapy   Informed consent: discussed and consent obtained   Timeout:  patient name, date  of birth, surgical site, and procedure verified Lesion destroyed using liquid nitrogen: Yes   Region frozen until ice ball extended beyond lesion: Yes   Outcome: patient tolerated procedure well with no complications   Post-procedure details: wound care instructions given     Return in  about 1 year (around 12/30/2023) for TBSE.  IAsher Muir, CMA, am acting as scribe for Armida Sans, MD.   Documentation: I have reviewed the above documentation for accuracy and completeness, and I agree with the above.  Armida Sans, MD

## 2023-01-01 ENCOUNTER — Encounter: Payer: Self-pay | Admitting: Dermatology

## 2023-03-03 ENCOUNTER — Telehealth: Payer: Self-pay | Admitting: Pulmonary Disease

## 2023-03-03 NOTE — Telephone Encounter (Signed)
Pt. On recall is going to be gone Nov. 15th and Return Dec. 5th can we try to get pt. Seen before he leaves please advise pt.

## 2023-03-04 NOTE — Telephone Encounter (Signed)
He can follow-up as needed.  He should continue follow-up with Dr. Althea Charon his primary care doctor.

## 2023-03-04 NOTE — Telephone Encounter (Signed)
Do you need to follow up with this patient? 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 Last CT was 12/23/2022 that showed his nodules had been stable for 2 years and not follow up CT was needed.

## 2023-03-04 NOTE — Telephone Encounter (Signed)
Let pt. Know nothing further [facility]

## 2023-03-18 ENCOUNTER — Other Ambulatory Visit: Payer: Self-pay | Admitting: Family Medicine

## 2023-03-18 DIAGNOSIS — E1169 Type 2 diabetes mellitus with other specified complication: Secondary | ICD-10-CM

## 2023-03-18 DIAGNOSIS — I700 Atherosclerosis of aorta: Secondary | ICD-10-CM

## 2023-03-18 NOTE — Telephone Encounter (Signed)
Requested medication (s) are due for refill today:   Yes  Requested medication (s) are on the active medication list:   Yes  Future visit scheduled:   Yes 10/17   Last ordered: 12/18/2022 #90, 0 refills  Returned because a lipid panel is due so unable to refill per protocol   Requested Prescriptions  Pending Prescriptions Disp Refills   atorvastatin (LIPITOR) 10 MG tablet [Pharmacy Med Name: ATORVASTATIN 10 MG TABLET] 90 tablet 0    Sig: TAKE 1 TABLET BY MOUTH EVERYDAY AT BEDTIME     Cardiovascular:  Antilipid - Statins Failed - 03/18/2023  1:31 AM      Failed - Lipid Panel in normal range within the last 12 months    Cholesterol  Date Value Ref Range Status  10/07/2021 188 <200 mg/dL Final   LDL Cholesterol (Calc)  Date Value Ref Range Status  10/07/2021 92 mg/dL (calc) Final    Comment:    Reference range: <100 . Desirable range <100 mg/dL for primary prevention;   <70 mg/dL for patients with CHD or diabetic patients  with > or = 2 CHD risk factors. Marland Kitchen LDL-C is now calculated using the Martin-Hopkins  calculation, which is a validated novel method providing  better accuracy than the Friedewald equation in the  estimation of LDL-C.  Horald Pollen et al. Lenox Ahr. 4259;563(87): 2061-2068  (http://education.[employer].com/faq/FAQ164)    HDL  Date Value Ref Range Status  10/07/2021 69 > OR = 40 mg/dL Final   Triglycerides  Date Value Ref Range Status  10/07/2021 167 (H) <150 mg/dL Final         Passed - Patient is not pregnant      Passed - Valid encounter within last 12 months    Recent Outpatient Visits           7 months ago Acute cystitis with hematuria   [place] [facility] [place], Netta Neat, DO   1 year ago Type 2 diabetes mellitus with hyperglycemia, without long-term current use of insulin [facility])   [place] [facility] [place], Netta Neat, DO   1 year ago Type 2 diabetes mellitus with other specified  complication, without long-term current use of insulin [facility])   [place] [facility] [place], Netta Neat, DO   2 years ago Hyperlipidemia associated with type 2 diabetes mellitus [facility])   [place] [facility] [place], Netta Neat, DO   2 years ago Benign hypertension with CKD (chronic kidney disease) stage III [facility])   [place] [facility] [place], Netta Neat, DO       Future Appointments             In 1 month Althea Charon, Netta Neat, DO  [facility], [place]   In 9 months Deirdre Evener, MD [facility]

## 2023-03-25 ENCOUNTER — Other Ambulatory Visit: Payer: Self-pay | Admitting: Family Medicine

## 2023-03-25 DIAGNOSIS — E1169 Type 2 diabetes mellitus with other specified complication: Secondary | ICD-10-CM

## 2023-03-25 DIAGNOSIS — E1165 Type 2 diabetes mellitus with hyperglycemia: Secondary | ICD-10-CM

## 2023-03-29 NOTE — Telephone Encounter (Signed)
Requested medications are due for refill today.  yes  Requested medications are on the active medications list.  yes  Last refill. 12/21/2022 #90 0 rf  Future visit scheduled.   yes  Notes to clinic.  Medication not assigned to a protocol. Please review for refill.    Requested Prescriptions  Pending Prescriptions Disp Refills   TRIJARDY XR 25-11-998 MG TB24 [Pharmacy Med Name: TRIJARDYXR1G TAB 25-5 MG] 90 tablet 0    Sig: TAKE 1 TABLET DAILY     Off-Protocol Failed - 03/25/2023  2:22 PM      Failed - Medication not assigned to a protocol, review manually.      Passed - Valid encounter within last 12 months    Recent Outpatient Visits           8 months ago Acute cystitis with hematuria   [place] [facility] [place], Netta Neat, DO   1 year ago Type 2 diabetes mellitus with hyperglycemia, without long-term current use of insulin [facility])   [place] [facility] [place], Netta Neat, DO   1 year ago Type 2 diabetes mellitus with other specified complication, without long-term current use of insulin [facility])   [place] [facility] [place], Netta Neat, DO   2 years ago Hyperlipidemia associated with type 2 diabetes mellitus [facility])   [place] [facility] [place], Netta Neat, DO   2 years ago Benign hypertension with CKD (chronic kidney disease) stage III [facility])   [place] [facility] Smitty Cords, DO       Future Appointments             In 3 weeks Althea Charon, Netta Neat, DO [place] [facility], [place]   In 9 months Deirdre Evener, MD [facility]            Signed Prescriptions Disp Refills   Continuous Glucose Sensor (DEXCOM G7 SENSOR) MISC 9 each 0    Sig: USE TO CHECK BLOOD SUGAR.  CHANGE SENSOR EVERY 10     DAYS.     Endocrinology: Diabetes - Testing Supplies Passed - 03/25/2023  2:22 PM      Passed - Valid  encounter within last 12 months    Recent Outpatient Visits           8 months ago Acute cystitis with hematuria   [place] [facility] [place], Netta Neat, DO   1 year ago Type 2 diabetes mellitus with hyperglycemia, without long-term current use of insulin [facility])   [place] [facility] [place], Netta Neat, DO   1 year ago Type 2 diabetes mellitus with other specified complication, without long-term current use of insulin [facility])   [place] [facility] [place], Netta Neat, DO   2 years ago Hyperlipidemia associated with type 2 diabetes mellitus [facility])   [place] [facility] [place], Netta Neat, DO   2 years ago Benign hypertension with CKD (chronic kidney disease) stage III [facility])   [place] [facility] [place], Netta Neat, DO       Future Appointments             In 3 weeks Althea Charon, Netta Neat, DO [place] [facility], [place]   In 9 months Deirdre Evener, MD [facility]

## 2023-03-29 NOTE — Telephone Encounter (Signed)
Requested Prescriptions  Pending Prescriptions Disp Refills   Continuous Glucose Sensor (DEXCOM G7 SENSOR) MISC [Pharmacy Med Name: DEXCOM G7 MIS SENSOR] 9 each 0    Sig: USE TO CHECK BLOOD SUGAR.  CHANGE SENSOR EVERY 10     DAYS.     Endocrinology: Diabetes - Testing Supplies Passed - 03/25/2023  2:22 PM      Passed - Valid encounter within last 12 months    Recent Outpatient Visits           8 months ago Acute cystitis with hematuria   [place] [facility] [place], Netta Neat, DO   1 year ago Type 2 diabetes mellitus with hyperglycemia, without long-term current use of insulin [facility])   [place] [facility] [place], Netta Neat, DO   1 year ago Type 2 diabetes mellitus with other specified complication, without long-term current use of insulin [facility])   [place] [facility] [place], Netta Neat, DO   2 years ago Hyperlipidemia associated with type 2 diabetes mellitus [facility])   [place] [facility] [place], Netta Neat, DO   2 years ago Benign hypertension with CKD (chronic kidney disease) stage III [facility])   [place] [facility] Smitty Cords, DO       Future Appointments             In 3 weeks Althea Charon, Netta Neat, DO [place] [facility], [place]   In 9 months Deirdre Evener, MD [place] [facility]             TRIJARDY XR 25-11-998 MG TB24 [Pharmacy Med Name: [id number] TAB 25-5 MG] 90 tablet 0    Sig: TAKE 1 TABLET DAILY     Off-Protocol Failed - 03/25/2023  2:22 PM      Failed - Medication not assigned to a protocol, review manually.      Passed - Valid encounter within last 12 months    Recent Outpatient Visits           8 months ago Acute cystitis with hematuria   [place] [facility] [place], Netta Neat, DO   1 year ago Type 2 diabetes mellitus with hyperglycemia, without long-term current  use of insulin [facility])   [place] [facility] [place], Netta Neat, DO   1 year ago Type 2 diabetes mellitus with other specified complication, without long-term current use of insulin [facility])   [place] [facility] [place], Netta Neat, DO   2 years ago Hyperlipidemia associated with type 2 diabetes mellitus [facility])   [place] [facility] [place], Netta Neat, DO   2 years ago Benign hypertension with CKD (chronic kidney disease) stage III [facility])   [place] [facility] [place], Netta Neat, DO       Future Appointments             In 3 weeks Althea Charon, Netta Neat, DO  [facility], [place]   In 9 months Deirdre Evener, MD [facility]

## 2023-04-19 ENCOUNTER — Other Ambulatory Visit: Payer: Self-pay | Admitting: Family Medicine

## 2023-04-19 DIAGNOSIS — R972 Elevated prostate specific antigen [PSA]: Secondary | ICD-10-CM

## 2023-04-19 DIAGNOSIS — I700 Atherosclerosis of aorta: Secondary | ICD-10-CM

## 2023-04-19 DIAGNOSIS — E041 Nontoxic single thyroid nodule: Secondary | ICD-10-CM

## 2023-04-19 DIAGNOSIS — N183 Chronic kidney disease, stage 3 unspecified: Secondary | ICD-10-CM

## 2023-04-19 DIAGNOSIS — I129 Hypertensive chronic kidney disease with stage 1 through stage 4 chronic kidney disease, or unspecified chronic kidney disease: Secondary | ICD-10-CM

## 2023-04-19 DIAGNOSIS — N401 Enlarged prostate with lower urinary tract symptoms: Secondary | ICD-10-CM

## 2023-04-19 DIAGNOSIS — E1169 Type 2 diabetes mellitus with other specified complication: Secondary | ICD-10-CM

## 2023-04-20 ENCOUNTER — Other Ambulatory Visit: Payer: Medicare Other

## 2023-04-21 ENCOUNTER — Other Ambulatory Visit: Payer: Medicare Other

## 2023-04-21 LAB — LIPID PANEL
Cholesterol: 152 mg/dL (ref <200)
HDL: 61 mg/dL (ref >=40)
LDL Cholesterol (Calc): 73 mg/dL
Non-HDL Cholesterol (Calc): 91 mg/dL (ref <130)
Total CHOL/HDL Ratio: 2.5 (calc) (ref <5.0)
Triglycerides: 95 mg/dL (ref <150)

## 2023-04-21 LAB — CBC WITH DIFFERENTIAL/PLATELET
Absolute Lymphocytes: 1426 {cells}/uL (ref 850–3900)
Absolute Monocytes: 815 {cells}/uL (ref 200–950)
Basophils Absolute: 68 {cells}/uL (ref 0–200)
Basophils Relative: 0.7 %
Eosinophils Absolute: 126 {cells}/uL (ref 15–500)
Eosinophils Relative: 1.3 %
HCT: 51.3 % — ABNORMAL HIGH (ref 38.5–50.0)
Hemoglobin: 16.6 g/dL (ref 13.2–17.1)
MCH: 28.9 pg (ref 27.0–33.0)
MCHC: 32.4 g/dL (ref 32.0–36.0)
MCV: 89.2 fL (ref 80.0–100.0)
MPV: 10.7 fL (ref 7.5–12.5)
Monocytes Relative: 8.4 %
Neutro Abs: 7265 {cells}/uL (ref 1500–7800)
Neutrophils Relative %: 74.9 %
Platelets: 366 10*3/uL (ref 140–400)
RBC: 5.75 10*6/uL (ref 4.20–5.80)
RDW: 13.2 % (ref 11.0–15.0)
Total Lymphocyte: 14.7 %
WBC: 9.7 10*3/uL (ref 3.8–10.8)

## 2023-04-21 LAB — COMPLETE METABOLIC PANEL WITH GFR
AG Ratio: 1.6 (calc) (ref 1.0–2.5)
ALT: 26 U/L (ref 9–46)
AST: 23 U/L (ref 10–35)
Albumin: 4.1 g/dL (ref 3.6–5.1)
Alkaline phosphatase (APISO): 51 U/L (ref 35–144)
BUN/Creatinine Ratio: 12 (calc) (ref 6–22)
BUN: 17 mg/dL (ref 7–25)
CO2: 26 mmol/L (ref 20–32)
Calcium: 9.8 mg/dL (ref 8.6–10.3)
Chloride: 102 mmol/L (ref 98–110)
Creat: 1.38 mg/dL — ABNORMAL HIGH (ref 0.70–1.28)
Globulin: 2.5 g/dL (ref 1.9–3.7)
Glucose, Bld: 166 mg/dL — ABNORMAL HIGH (ref 65–99)
Potassium: 5 mmol/L (ref 3.5–5.3)
Sodium: 140 mmol/L (ref 135–146)
Total Bilirubin: 0.5 mg/dL (ref 0.2–1.2)
Total Protein: 6.6 g/dL (ref 6.1–8.1)
eGFR: 52 mL/min/{1.73_m2} — ABNORMAL LOW (ref >=60)

## 2023-04-21 LAB — HEMOGLOBIN A1C
Hgb A1c MFr Bld: 7.7 %{Hb} — ABNORMAL HIGH (ref <5.7)
Mean Plasma Glucose: 174 mg/dL
eAG (mmol/L): 9.7 mmol/L

## 2023-04-21 LAB — HM DIABETES EYE EXAM

## 2023-04-21 LAB — T4, FREE: Free T4: 1.2 ng/dL (ref 0.8–1.8)

## 2023-04-21 LAB — TSH: TSH: 2.05 m[IU]/L (ref 0.40–4.50)

## 2023-04-21 LAB — PSA: PSA: 2.72 ng/mL (ref <=4.00)

## 2023-04-22 ENCOUNTER — Ambulatory Visit (INDEPENDENT_AMBULATORY_CARE_PROVIDER_SITE_OTHER): Payer: Medicare Other | Admitting: Family Medicine

## 2023-04-22 ENCOUNTER — Encounter: Payer: Self-pay | Admitting: Family Medicine

## 2023-04-22 VITALS — BP 130/72 | HR 81 | Ht 67.75 in | Wt 187.0 lb

## 2023-04-22 DIAGNOSIS — E1169 Type 2 diabetes mellitus with other specified complication: Secondary | ICD-10-CM | POA: Diagnosis not present

## 2023-04-22 DIAGNOSIS — E041 Nontoxic single thyroid nodule: Secondary | ICD-10-CM

## 2023-04-22 DIAGNOSIS — N401 Enlarged prostate with lower urinary tract symptoms: Secondary | ICD-10-CM

## 2023-04-22 DIAGNOSIS — I700 Atherosclerosis of aorta: Secondary | ICD-10-CM

## 2023-04-22 DIAGNOSIS — E785 Hyperlipidemia, unspecified: Secondary | ICD-10-CM

## 2023-04-22 DIAGNOSIS — R972 Elevated prostate specific antigen [PSA]: Secondary | ICD-10-CM | POA: Diagnosis not present

## 2023-04-22 DIAGNOSIS — R350 Frequency of micturition: Secondary | ICD-10-CM

## 2023-04-22 DIAGNOSIS — N183 Chronic kidney disease, stage 3 unspecified: Secondary | ICD-10-CM

## 2023-04-22 DIAGNOSIS — I129 Hypertensive chronic kidney disease with stage 1 through stage 4 chronic kidney disease, or unspecified chronic kidney disease: Secondary | ICD-10-CM

## 2023-04-22 MED ORDER — ATORVASTATIN CALCIUM 10 MG PO TABS: 10.0000 mg | ORAL_TABLET | Freq: Every day | ORAL | 3 refills | Status: DC

## 2023-04-22 NOTE — Progress Notes (Signed)
Subjective:    Patient ID: Jorge Mayer, male    DOB: 1946/02/09, 77 y.o.   MRN: [id number]  Jorge Mayer is a 77 y.o. male presenting on 04/22/2023 for Annual Exam   HPI  Here for Annual Physical and Lab Review   Discussed the use of AI scribe software for clinical note transcription with the patient, who gave verbal consent to proceed.       BPH with LUTS Elevated PSA PSA stable at 2.72 (prior range 2s has been stable), past history of PSA 23 with UTI, no longer following with Urology No significant BPH symptoms - only mild.   HYPERLIPIDEMIA: Hx CVA - Reports no concerns. Last lipid panel 04/2023 LDL 73, improvement. Attributed to diet on cruise Continues Atorvastatin 10mg  daily   CHRONIC DM, Type 2: A1c 7.7, similar to prior 7.4 CGM very useful for him to follow trend. 49% in range, avg 150-180 range Meds: Trijardy 25-5-1000mg  daily (Jardiance 25 / Tradjenta 5 / metformin 1000 - combo pill) Reports good compliance. Tolerating well w/o side-effects Not on ACE ARB due to CKD per Renal - considering ACEi lisinopril Urine microalbumin due today Jorge Mayer has history of cataracts History of CVA Identified several foods and dairy that can trigger spikes. Denies hypoglycemia, polyuria, visual changes, numbness or tingling.   CHRONIC HTN with CKD-IIIa Last lab result 04/2023 showed Creatinine stable to 1.38 Previously with Dr Christa See Nephrology Prior work up SPEP/UPEP/ANA, renal ultrasound, they were considering ACEi in future Current Meds - On Jardiance for CKD (DM) Denies CP, dyspnea, HA, edema, dizziness / lightheadedness  Former smoker 1ppd for 10 years, quit 06-21-75 His brother died in 2020-07-20 to Lung Cancer, discovered at Stage IV. Jorge Mayer has had 2 brothers passed with Lung Cancer age 23-70s  No further LDCT per Pulmonology   GERD Per Pulm Esomeprazole 40mg  daily, had tried 20mg  previously. Doing well on 40   BPH LUTS Jorge Mayer has had some UTI in past, has LUTS Has  seen Urology   Heart Murmur Similar to previous. Asymptomatic  Mild elevated HCT Attributed to less hydration.        Atherosclerosis of Aorta Identified on CT imaging previously   Health Maintenance  Offer Prevnar-20, in future.  PSA 2.72 - stable from previous     04/22/2023    9:51 AM 10/14/2021    2:33 PM 04/15/2021    9:09 AM  Depression screen PHQ 2/9  Decreased Interest 0 0 0  Down, Depressed, Hopeless 0 0 0  PHQ - 2 Score 0 0 0  Altered sleeping 0 1 0  Tired, decreased energy 0 1 0  Change in appetite 0 0 0  Feeling bad or failure about yourself  0 0 0  Trouble concentrating 0 0 0  Moving slowly or fidgety/restless 0 0 0  Suicidal thoughts 0 0 0  PHQ-9 Score 0 2 0  Difficult doing work/chores Not difficult at all Not difficult at all Not difficult at all    Past Medical History:  Diagnosis Date   Actinic keratosis    Chronic kidney disease    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   left upper quad abdomen, mod to severe, excised 04/22/21   GERD (gastroesophageal reflux disease)    Hypertension    Prostate disease    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, txted in [place]   Stroke [facility])    Urinary incontinence    Past Surgical History:  Procedure Laterality Date  CATARACT EXTRACTION Bilateral 2024   SHOULDER SURGERY     Right side   Social History   Socioeconomic History   Marital status: Married    Spouse name: Not on file   Number of children: Not on file   Years of education: Not on file   Highest education level: Not on file  Occupational History   Not on file  Tobacco Use   Smoking status: Former    Current packs/day: 0.00    Average packs/day: 1 pack/day for 10.0 years (10.0 ttl pk-yrs)    Types: Cigarettes    Start date: 07/06/1964    Quit date: 07/06/1974    Years since quitting: 48.8   Smokeless tobacco: Never  Substance and Sexual Activity   Alcohol use: Yes    Alcohol/week: 1.0 standard drink of alcohol    Types: 1  Standard drinks or equivalent per week   Drug use: Never   Sexual activity: Not on file  Other Topics Concern   Not on file  Social History Narrative   Not on file   Social Determinants of Health   Financial Resource Strain: Not on file  Food Insecurity: Not on file  Transportation Needs: Not on file  Physical Activity: Not on file  Stress: Not on file  Social Connections: Not on file  Intimate Partner Violence: Not on file   Family History  Problem Relation Age of Onset   Heart disease Mother    Heart disease Father    Lung cancer Brother 15   Current Outpatient Medications on File Prior to Visit  Medication Sig   Cholecalciferol (D3-1000) 25 MCG (1000 UT) tablet Take 1,000 Units by mouth daily.   Continuous Glucose Sensor (DEXCOM G7 SENSOR) MISC USE TO CHECK BLOOD SUGAR.  CHANGE SENSOR EVERY 10     DAYS.   CRANBERRY PO Take by mouth.   D-MANNOSE PO Take by mouth.   esomeprazole (NEXIUM) 40 MG capsule TAKE 1 CAPSULE DAILY   Magnesium 400 MG TABS Take by mouth.   TRIJARDY XR 25-11-998 MG TB24 TAKE 1 TABLET DAILY   scopolamine (TRANSDERM-SCOP) 1 MG/3DAYS Place 1 patch (1.5 mg total) onto the skin every 3 (three) days. For motion sickness. Start 2 hr before onset symptoms, up to 12 hr before (Patient not taking: Reported on 04/22/2023)   No current facility-administered medications on file prior to visit.    Review of Systems  Constitutional:  Negative for activity change, appetite change, chills, diaphoresis, fatigue and fever.  HENT:  Negative for congestion and hearing loss.   Eyes:  Negative for visual disturbance.  Respiratory:  Negative for cough, chest tightness, shortness of breath and wheezing.   Cardiovascular:  Negative for chest pain, palpitations and leg swelling.  Gastrointestinal:  Negative for abdominal pain, constipation, diarrhea, nausea and vomiting.  Genitourinary:  Negative for dysuria, frequency and hematuria.  Musculoskeletal:  Negative for  arthralgias and neck pain.  Skin:  Negative for rash.  Neurological:  Negative for dizziness, weakness, light-headedness, numbness and headaches.  Hematological:  Negative for adenopathy.  Psychiatric/Behavioral:  Negative for behavioral problems, dysphoric mood and sleep disturbance.    Per HPI unless specifically indicated above      Objective:    BP 130/72   Pulse 81   Ht 5' 7.75" (1.721 m)   Wt 187 lb (84.8 kg)   SpO2 99%   BMI 28.64 kg/m   Wt Readings from Last 3 Encounters:  04/22/23 187 lb (84.8 kg)  07/29/22  187 lb 9.6 oz (85.1 kg)  12/08/21 191 lb (86.6 kg)    Physical Exam Vitals and nursing note reviewed.  Constitutional:      General: Jorge Mayer is not in acute distress.    Appearance: Jorge Mayer is well-developed. Jorge Mayer is not diaphoretic.     Comments: Well-appearing, comfortable, cooperative  HENT:     Head: Normocephalic and atraumatic.  Eyes:     General:        Right eye: No discharge.        Left eye: No discharge.     Conjunctiva/sclera: Conjunctivae normal.     Pupils: Pupils are equal, round, and reactive to light.  Neck:     Thyroid: No thyromegaly.     Vascular: No carotid bruit.  Cardiovascular:     Rate and Rhythm: Normal rate and regular rhythm.     Pulses: Normal pulses.     Heart sounds: Murmur (stable 2/6) heard.  Pulmonary:     Effort: Pulmonary effort is normal. No respiratory distress.     Breath sounds: Normal breath sounds. No wheezing or rales.  Abdominal:     General: Bowel sounds are normal. There is no distension.     Palpations: Abdomen is soft. There is no mass.     Tenderness: There is no abdominal tenderness.  Musculoskeletal:        General: No tenderness. Normal range of motion.     Cervical back: Normal range of motion and neck supple.     Right lower leg: No edema.     Left lower leg: No edema.     Comments: Upper / Lower Extremities: - Normal muscle tone, strength bilateral upper extremities 5/5, lower extremities 5/5   Lymphadenopathy:     Cervical: No cervical adenopathy.  Skin:    General: Skin is warm and dry.     Findings: No erythema or rash.  Neurological:     Mental Status: Jorge Mayer is alert and oriented to person, place, and time.     Comments: Distal sensation intact to light touch all extremities  Psychiatric:        Mood and Affect: Mood normal.        Behavior: Behavior normal.        Thought Content: Thought content normal.     Comments: Well groomed, good eye contact, normal speech and thoughts     Diabetic Foot Exam - Simple   Simple Foot Form Diabetic Foot exam was performed with the following findings: Yes 04/22/2023 10:25 AM  Visual Inspection See comments: Yes Sensation Testing See comments: Yes Pulse Check Posterior Tibialis and Dorsalis pulse intact bilaterally: Yes Comments Mild reduced sensation monofilament bilateral heels and plantar MTP and great toe. No ulceration. Some dry skin and callus formation. High arch L worse than R      Results for orders placed or performed in visit on 04/22/23  T4, free  Result Value Ref Range   Free T4 1.2 0.8 - 1.8 ng/dL  TSH  Result Value Ref Range   TSH 2.05 0.40 - 4.50 mIU/L  PSA  Result Value Ref Range   PSA 2.72 < OR = 4.00 ng/mL  CBC with Differential/Platelet  Result Value Ref Range   WBC 9.7 3.8 - 10.8 Thousand/uL   RBC 5.75 4.20 - 5.80 Million/uL   Hemoglobin 16.6 13.2 - 17.1 g/dL   HCT 62.9 (H) 52.8 - 41.3 %   MCV 89.2 80.0 - 100.0 fL   MCH 28.9 27.0 -  33.0 pg   MCHC 32.4 32.0 - 36.0 g/dL   RDW 08.6 57.8 - 46.9 %   Platelets 366 140 - 400 Thousand/uL   MPV 10.7 7.5 - 12.5 fL   Neutro Abs 7,265 1,500 - 7,800 cells/uL   Absolute Lymphocytes 1,426 850 - 3,900 cells/uL   Absolute Monocytes 815 200 - 950 cells/uL   Eosinophils Absolute 126 15 - 500 cells/uL   Basophils Absolute 68 0 - 200 cells/uL   Neutrophils Relative % 74.9 %   Total Lymphocyte 14.7 %   Monocytes Relative 8.4 %   Eosinophils Relative 1.3 %    Basophils Relative 0.7 %  COMPLETE METABOLIC PANEL WITH GFR  Result Value Ref Range   Glucose, Bld 166 (H) 65 - 99 mg/dL   BUN 17 7 - 25 mg/dL   Creat 6.29 (H) 5.28 - 1.28 mg/dL   eGFR 52 (L) > OR = 60 mL/min/1.29m2   BUN/Creatinine Ratio 12 6 - 22 (calc)   Sodium 140 135 - 146 mmol/L   Potassium 5.0 3.5 - 5.3 mmol/L   Chloride 102 98 - 110 mmol/L   CO2 26 20 - 32 mmol/L   Calcium 9.8 8.6 - 10.3 mg/dL   Total Protein 6.6 6.1 - 8.1 g/dL   Albumin 4.1 3.6 - 5.1 g/dL   Globulin 2.5 1.9 - 3.7 g/dL (calc)   AG Ratio 1.6 1.0 - 2.5 (calc)   Total Bilirubin 0.5 0.2 - 1.2 mg/dL   Alkaline phosphatase (APISO) 51 35 - 144 U/L   AST 23 10 - 35 U/L   ALT 26 9 - 46 U/L  Lipid panel  Result Value Ref Range   Cholesterol 152 <200 mg/dL   HDL 61 > OR = 40 mg/dL   Triglycerides 95 <413 mg/dL   LDL Cholesterol (Calc) 73 mg/dL (calc)   Total CHOL/HDL Ratio 2.5 <5.0 (calc)   Non-HDL Cholesterol (Calc) 91 <244 mg/dL (calc)  Hemoglobin W1U  Result Value Ref Range   Hgb A1c MFr Bld 7.7 (H) <5.7 % of total Hgb   Mean Plasma Glucose 174 mg/dL   eAG (mmol/L) 9.7 mmol/L      Assessment & Plan:   Problem List Items Addressed This Visit     Atherosclerosis of aorta (HCC)   Relevant Medications   atorvastatin (LIPITOR) 10 MG tablet   Benign hypertension with CKD (chronic kidney disease) stage III (HCC)   Relevant Medications   atorvastatin (LIPITOR) 10 MG tablet   Other Relevant Orders   BASIC METABOLIC PANEL WITH GFR   Benign prostatic hyperplasia with urinary frequency   Elevated PSA   Hyperlipidemia associated with type 2 diabetes mellitus (HCC)   Relevant Medications   atorvastatin (LIPITOR) 10 MG tablet   Thyroid nodule   Type 2 diabetes mellitus with other specified complication (HCC) - Primary   Relevant Medications   atorvastatin (LIPITOR) 10 MG tablet   Other Relevant Orders   Urine Microalbumin w/creat. ratio   Hemoglobin A1c    Updated Health Maintenance information Reviewed  recent lab results with patient Encouraged improvement to lifestyle with diet and exercise Goal of weight loss  Assessment and Plan    Chronic Kidney Disease (CKD) Stage 3 Stable GFR in the 50s. Patient expressed concern about a slight decrease in GFR, but fluctuations are within normal range for this stage of CKD. Patient is already on Trijardy, which contains Jardiance, a medication known to help preserve kidney function. -Continue current medication regimen. -Order urine test today  to monitor kidney function. -Plan to check GFR and A1c in April 2025.  Hyperlipidemia Well controlled with Atorvastatin. Recent labs show good cholesterol levels. -Refill Atorvastatin prescription. -Continue current medication regimen.  Diabetes Mellitus A1c 7.7, slightly increased from previous 7.2-7.3. Patient is actively monitoring blood sugar levels and adjusting diet accordingly. -Continue current medication regimen. DM Foot exam -Plan to check A1c in April 2025.  Peripheral Neuropathy Patient reports increasing loss of sensation in feet, affecting balance. -Continue monitoring symptoms.  Vaccinations Patient received flu shot a couple of months ago. Pneumonia shot received in 2021, but may be missing an older dose or new dose. Patient has not received shingles vaccine. -Consider Prevnar 20, an upgraded pneumonia vaccine, at the pharmacy. -Check vaccination records for shingles vaccine.       Meds ordered this encounter  Medications   atorvastatin (LIPITOR) 10 MG tablet    Sig: Take 1 tablet (10 mg total) by mouth at bedtime.    Dispense:  90 tablet    Refill:  3    Add future refills      Follow up plan: Return in about 6 months (around 10/21/2023) for 6 month fasting lab then 1 week later Follow-up DM, CKD results.  Future lab 6 months BMET GFR A1c   Saralyn Pilar, DO [facility] 04/22/2023, 10:00 AM

## 2023-04-22 NOTE — Patient Instructions (Addendum)
Thank you for coming to the office today.  Future consider Prevnar-20 upgraded pneumonia vaccine, if interested at the pharmacy.  Urine test today  GFR stable in 50s, CKD-III  No change to medications  Refill Cholesterol medicine, as it is working well  Repeat lab GFR and A1c next time.  DUE for FASTING BLOOD WORK (no food or drink after midnight before the lab appointment, only water or coffee without cream/sugar on the morning of)  SCHEDULE "Lab Only" visit in the morning at the clinic for lab draw in 6 MONTHS   - Make sure Lab Only appointment is at about 1 week before your next appointment, so that results will be available  For Lab Results, once available within 2-3 days of blood draw, you can can log in to MyChart online to view your results and a brief explanation. Also, we can discuss results at next follow-up visit.   Please schedule a Follow-up Appointment to: Return in about 6 months (around 10/21/2023) for 6 month fasting lab then 1 week later Follow-up DM, CKD results.  If you have any other questions or concerns, please feel free to call the office or send a message through MyChart. You may also schedule an earlier appointment if necessary.  Additionally, you may be receiving a survey about your experience at our office within a few days to 1 week by e-mail or mail. We value your feedback.  Saralyn Pilar, DO [facility], [place]

## 2023-04-23 LAB — MICROALBUMIN / CREATININE URINE RATIO
Creatinine, Urine: 67 mg/dL (ref 20–320)
Microalb Creat Ratio: 3 mg/g{creat} (ref <30)
Microalb, Ur: 0.2 mg/dL

## 2023-04-26 ENCOUNTER — Encounter: Payer: Self-pay | Admitting: Family Medicine

## 2023-06-20 ENCOUNTER — Other Ambulatory Visit: Payer: Self-pay | Admitting: Internal Medicine

## 2023-06-20 DIAGNOSIS — E1169 Type 2 diabetes mellitus with other specified complication: Secondary | ICD-10-CM

## 2023-06-20 DIAGNOSIS — I700 Atherosclerosis of aorta: Secondary | ICD-10-CM

## 2023-06-21 NOTE — Telephone Encounter (Signed)
Requested Prescriptions  Pending Prescriptions Disp Refills   atorvastatin (LIPITOR) 10 MG tablet [Pharmacy Med Name: ATORVASTATIN 10 MG TABLET] 90 tablet 2    Sig: TAKE 1 TABLET BY MOUTH EVERYDAY AT BEDTIME     Cardiovascular:  Antilipid - Statins Failed - 06/21/2023  4:17 PM      Failed - Lipid Panel in normal range within the last 12 months    Cholesterol  Date Value Ref Range Status  04/20/2023 152 <200 mg/dL Final   LDL Cholesterol (Calc)  Date Value Ref Range Status  04/20/2023 73 mg/dL (calc) Final    Comment:    Reference range: <100 . Desirable range <100 mg/dL for primary prevention;   <70 mg/dL for patients with CHD or diabetic patients  with > or = 2 CHD risk factors. Marland Kitchen LDL-C is now calculated using the Martin-Hopkins  calculation, which is a validated novel method providing  better accuracy than the Friedewald equation in the  estimation of LDL-C.  Horald Pollen et al. Lenox Ahr. 5784;696(29): 2061-2068  (http://education.[employer].com/faq/FAQ164)    HDL  Date Value Ref Range Status  04/20/2023 61 > OR = 40 mg/dL Final   Triglycerides  Date Value Ref Range Status  04/20/2023 95 <150 mg/dL Final         Passed - Patient is not pregnant      Passed - Valid encounter within last 12 months    Recent Outpatient Visits           2 months ago Type 2 diabetes mellitus with other specified complication, without long-term current use of insulin [facility])   [place] [facility] Smitty Cords, DO   10 months ago Acute cystitis with hematuria   [place] [facility] [place], Netta Neat, DO   1 year ago Type 2 diabetes mellitus with hyperglycemia, without long-term current use of insulin [facility])   [place] [facility] [place], Netta Neat, DO   2 years ago Type 2 diabetes mellitus with other specified complication, without long-term current use of insulin [facility])   [place] [facility] [place], Netta Neat, DO   2 years ago Hyperlipidemia associated with type 2 diabetes mellitus [facility])   [place] [facility] [place], Netta Neat, DO       Future Appointments             In 4 months Althea Charon, Netta Neat, DO [facility]   In 6 months Deirdre Evener, MD [place]

## 2023-06-25 ENCOUNTER — Other Ambulatory Visit: Payer: Self-pay | Admitting: Pulmonary Disease

## 2023-07-03 ENCOUNTER — Other Ambulatory Visit: Payer: Self-pay | Admitting: Family Medicine

## 2023-07-03 DIAGNOSIS — E1165 Type 2 diabetes mellitus with hyperglycemia: Secondary | ICD-10-CM

## 2023-07-07 ENCOUNTER — Other Ambulatory Visit: Payer: Self-pay | Admitting: Family Medicine

## 2023-07-07 DIAGNOSIS — E1165 Type 2 diabetes mellitus with hyperglycemia: Secondary | ICD-10-CM

## 2023-07-08 MED ORDER — DEXCOM G7 SENSOR MISC: 0 refills | Status: DC

## 2023-07-08 NOTE — Telephone Encounter (Signed)
 Refused Dexcom G7 Sensor.   This is a duplicate request.

## 2023-07-18 ENCOUNTER — Encounter: Payer: Self-pay | Admitting: Family Medicine

## 2023-07-19 ENCOUNTER — Other Ambulatory Visit: Payer: Self-pay

## 2023-07-19 MED ORDER — ESOMEPRAZOLE MAGNESIUM 40 MG PO CPDR: 40.0000 mg | DELAYED_RELEASE_CAPSULE | Freq: Every day | ORAL | 3 refills | Status: DC

## 2023-10-01 ENCOUNTER — Other Ambulatory Visit: Payer: Self-pay | Admitting: Family Medicine

## 2023-10-01 DIAGNOSIS — E1165 Type 2 diabetes mellitus with hyperglycemia: Secondary | ICD-10-CM

## 2023-10-04 NOTE — Telephone Encounter (Signed)
 Requested Prescriptions  Pending Prescriptions Disp Refills   Continuous Glucose Sensor (DEXCOM G7 SENSOR) MISC [Pharmacy Med Name: DEXCOM G7 MIS SENSOR] 9 each 1    Sig: USE TO CHECK BLOOD SUGAR.  CHANGE SENSOR EVERY 10     DAYS.     Endocrinology: Diabetes - Testing Supplies Failed - 10/04/2023  2:28 PM      Failed - Valid encounter within last 12 months    Recent Outpatient Visits   None     Future Appointments             In 3 weeks Althea Charon, Netta Neat, DO [facility], [place]   In 2 months Deirdre Evener, MD [facility]

## 2023-10-21 ENCOUNTER — Other Ambulatory Visit: Payer: Self-pay

## 2023-10-21 DIAGNOSIS — E1169 Type 2 diabetes mellitus with other specified complication: Secondary | ICD-10-CM

## 2023-10-21 DIAGNOSIS — N183 Chronic kidney disease, stage 3 unspecified: Secondary | ICD-10-CM

## 2023-10-22 LAB — BASIC METABOLIC PANEL WITHOUT GFR
BUN/Creatinine Ratio: 16 (calc) (ref 6–22)
BUN: 22 mg/dL (ref 7–25)
CO2: 28 mmol/L (ref 20–32)
Calcium: 9.6 mg/dL (ref 8.6–10.3)
Chloride: 101 mmol/L (ref 98–110)
Creat: 1.38 mg/dL — ABNORMAL HIGH (ref 0.70–1.28)
Glucose, Bld: 184 mg/dL — ABNORMAL HIGH (ref 65–99)
Potassium: 5 mmol/L (ref 3.5–5.3)
Sodium: 139 mmol/L (ref 135–146)

## 2023-10-22 LAB — HEMOGLOBIN A1C
Hgb A1c MFr Bld: 8 % — ABNORMAL HIGH (ref <5.7)
Mean Plasma Glucose: 183 mg/dL
eAG (mmol/L): 10.1 mmol/L

## 2023-10-28 ENCOUNTER — Ambulatory Visit (INDEPENDENT_AMBULATORY_CARE_PROVIDER_SITE_OTHER): Payer: Self-pay | Admitting: Family Medicine

## 2023-10-28 ENCOUNTER — Other Ambulatory Visit: Payer: Self-pay | Admitting: Family Medicine

## 2023-10-28 ENCOUNTER — Encounter: Payer: Self-pay | Admitting: Family Medicine

## 2023-10-28 VITALS — BP 126/76 | HR 92 | Resp 18 | Ht 67.75 in | Wt 188.4 lb

## 2023-10-28 DIAGNOSIS — N529 Male erectile dysfunction, unspecified: Secondary | ICD-10-CM

## 2023-10-28 DIAGNOSIS — Z23 Encounter for immunization: Secondary | ICD-10-CM | POA: Diagnosis not present

## 2023-10-28 DIAGNOSIS — E785 Hyperlipidemia, unspecified: Secondary | ICD-10-CM

## 2023-10-28 DIAGNOSIS — E1169 Type 2 diabetes mellitus with other specified complication: Secondary | ICD-10-CM

## 2023-10-28 DIAGNOSIS — Z7984 Long term (current) use of oral hypoglycemic drugs: Secondary | ICD-10-CM

## 2023-10-28 DIAGNOSIS — E041 Nontoxic single thyroid nodule: Secondary | ICD-10-CM

## 2023-10-28 DIAGNOSIS — N183 Chronic kidney disease, stage 3 unspecified: Secondary | ICD-10-CM

## 2023-10-28 DIAGNOSIS — N401 Enlarged prostate with lower urinary tract symptoms: Secondary | ICD-10-CM

## 2023-10-28 DIAGNOSIS — R972 Elevated prostate specific antigen [PSA]: Secondary | ICD-10-CM

## 2023-10-28 DIAGNOSIS — Z0000 Encounter for general adult medical examination without abnormal findings: Secondary | ICD-10-CM

## 2023-10-28 MED ORDER — TADALAFIL 20 MG PO TABS
10.0000 mg | ORAL_TABLET | ORAL | 5 refills | Status: DC | PRN
Start: 2023-10-28 — End: 2024-01-27

## 2023-10-28 NOTE — Progress Notes (Signed)
 Subjective:    Patient ID: Jorge Mayer, male    DOB: December 16, 1945, 78 y.o.   MRN: [id number]  Jorge Mayer is a 78 y.o. male presenting on 10/28/2023 for Follow-up (Dm and CKD Labs)   HPI  Discussed the use of AI scribe software for clinical note transcription with the patient, who gave verbal consent to proceed.  History of Present Illness   Jorge Mayer is a 78 year old male with type 2 diabetes who presents for a routine follow-up visit.       HYPERLIPIDEMIA: Hx CVA - Reports no concerns. Last lipid panel 04/2023 LDL 73, improvement. Attributed to diet on cruise Continues Atorvastatin  10mg  daily   CHRONIC DM, Type 2: A1c 8.0, prior range 7.7 He admits goal to improved diet with reduced portions, smaller meal Using CGM Blood sugar spikes occur even after consuming meals that typically do not affect his glucose levels, such as rice and beans with chicken. He has noticed that smaller, more frequent meals help manage his blood sugar levels better. He also experiences spikes in blood sugar during the night and early morning, which he finds perplexing as they occur hours after eating. Meds: Trijardy  25-5-1000mg  daily (Jardiance 25 / Tradjenta 5 / metformin 1000 - combo pill) Reports good compliance. Tolerating well w/o side-effects He has history of cataracts History of CVA Denies hypoglycemia, polyuria, visual changes, numbness or tingling.   CHRONIC HTN with CKD-IIIa Update on labs. eGFR calculated 49.8 Previously with Dr Wash [facility] Prior work up SPEP/UPEP/ANA, renal ultrasound, they were considering ACEi in future Current Meds - On Jardiance for CKD (DM) Denies CP, dyspnea, HA, edema, dizziness / lightheadedness  Former smoker 1ppd for 10 years, quit Nov 26, 1974 His brother died in 06-27-2020 to Lung Cancer, discovered at Stage IV. He has had 2 brothers passed with Lung Cancer age 10-70s No further LDCT per Pulmonology at this time.   GERD Per  Pulm Esomeprazole  40mg  daily, had tried 20mg  previously. Doing well on 40   Heart Murmur Similar to previous. Asymptomatic   Mild elevated HCT Attributed to less hydration.    Erectile Dysfunction Requesting re-trial on PDE5 Interested in Tadalafil    Atherosclerosis of Aorta Identified on CT imaging previously     Health Maintenance   Prevnar-20 today      10/28/2023    8:49 AM 04/22/2023    9:51 AM 10/14/2021    2:33 PM  Depression screen PHQ 2/9  Decreased Interest 0 0 0  Down, Depressed, Hopeless 0 0 0  PHQ - 2 Score 0 0 0  Altered sleeping 1 0 1  Tired, decreased energy 1 0 1  Change in appetite 0 0 0  Feeling bad or failure about yourself  0 0 0  Trouble concentrating 0 0 0  Moving slowly or fidgety/restless 0 0 0  Suicidal thoughts 0 0 0  PHQ-9 Score 2 0 2  Difficult doing work/chores Not difficult at all Not difficult at all Not difficult at all       10/28/2023    8:49 AM 04/22/2023    9:51 AM 10/14/2021    2:34 PM 04/15/2021    9:09 AM  GAD 7 : Generalized Anxiety Score  Nervous, Anxious, on Edge 1 0 0 0  Control/stop worrying 1 0 0 0  Worry too much - different things 0 0 0 0  Trouble relaxing 0 0 0 0  Restless 0 0 0 0  Easily annoyed or irritable 0 0 0 0  Afraid - awful might happen 0 0 0 0  Total GAD 7 Score 2 0 0 0  Anxiety Difficulty Not difficult at all  Not difficult at all Not difficult at all    Social History   Tobacco Use   Smoking status: Former    Current packs/day: 0.00    Average packs/day: 1 pack/day for 10.0 years (10.0 ttl pk-yrs)    Types: Cigarettes    Start date: 07/06/1964    Quit date: 07/06/1974    Years since quitting: 49.3   Smokeless tobacco: Never  Substance Use Topics   Alcohol use: Yes    Alcohol/week: 1.0 standard drink of alcohol    Types: 1 Standard drinks or equivalent per week   Drug use: Never    Review of Systems  Constitutional:  Negative for activity change, appetite change, chills, diaphoresis,  fatigue and fever.  HENT:  Negative for congestion and hearing loss.   Eyes:  Negative for visual disturbance.  Respiratory:  Negative for cough, chest tightness, shortness of breath and wheezing.   Cardiovascular:  Negative for chest pain, palpitations and leg swelling.  Gastrointestinal:  Negative for abdominal pain, constipation, diarrhea, nausea and vomiting.  Genitourinary:  Negative for dysuria, frequency and hematuria.  Musculoskeletal:  Negative for arthralgias and neck pain.  Skin:  Negative for rash.  Neurological:  Negative for dizziness, weakness, light-headedness, numbness and headaches.  Hematological:  Negative for adenopathy.  Psychiatric/Behavioral:  Negative for behavioral problems, dysphoric mood and sleep disturbance.    Per HPI unless specifically indicated above     Objective:    BP 126/76 (BP Location: Left Arm, Patient Position: Sitting, Cuff Size: Normal)   Pulse 92   Resp 18   Ht 5' 7.75" (1.721 m)   Wt 188 lb 6.4 oz (85.5 kg)   SpO2 96%   BMI 28.86 kg/m   Wt Readings from Last 3 Encounters:  10/28/23 188 lb 6.4 oz (85.5 kg)  04/22/23 187 lb (84.8 kg)  07/29/22 187 lb 9.6 oz (85.1 kg)    Physical Exam Vitals and nursing note reviewed.  Constitutional:      General: He is not in acute distress.    Appearance: He is well-developed. He is not diaphoretic.     Comments: Well-appearing, comfortable, cooperative  HENT:     Head: Normocephalic and atraumatic.  Eyes:     General:        Right eye: No discharge.        Left eye: No discharge.     Conjunctiva/sclera: Conjunctivae normal.     Pupils: Pupils are equal, round, and reactive to light.  Neck:     Thyroid : No thyromegaly.     Vascular: No carotid bruit.  Cardiovascular:     Rate and Rhythm: Normal rate and regular rhythm.     Pulses: Normal pulses.     Heart sounds: Murmur heard.  Pulmonary:     Effort: Pulmonary effort is normal. No respiratory distress.     Breath sounds: Normal breath  sounds. No wheezing or rales.  Abdominal:     General: Bowel sounds are normal. There is no distension.     Palpations: Abdomen is soft. There is no mass.     Tenderness: There is no abdominal tenderness.  Musculoskeletal:        General: No tenderness. Normal range of motion.     Cervical back: Normal range of motion and neck supple.     Right lower leg: No  edema.     Left lower leg: No edema.     Comments: Upper / Lower Extremities: - Normal muscle tone, strength bilateral upper extremities 5/5, lower extremities 5/5  Lymphadenopathy:     Cervical: No cervical adenopathy.  Skin:    General: Skin is warm and dry.     Findings: No erythema or rash.  Neurological:     Mental Status: He is alert and oriented to person, place, and time.     Comments: Distal sensation intact to light touch all extremities  Psychiatric:        Mood and Affect: Mood normal.        Behavior: Behavior normal.        Thought Content: Thought content normal.     Comments: Well groomed, good eye contact, normal speech and thoughts     Results for orders placed or performed in visit on 10/21/23  Hemoglobin A1c   Collection Time: 10/21/23  9:19 AM  Result Value Ref Range   Hgb A1c MFr Bld 8.0 (H) <5.7 %   Mean Plasma Glucose 183 mg/dL   eAG (mmol/L) 69.6 mmol/L  BASIC METABOLIC PANEL WITH GFR   Collection Time: 10/21/23  9:19 AM  Result Value Ref Range   Glucose, Bld 184 (H) 65 - 99 mg/dL   BUN 22 7 - 25 mg/dL   Creat 2.95 (H) 2.84 - 1.28 mg/dL   BUN/Creatinine Ratio 16 6 - 22 (calc)   Sodium 139 135 - 146 mmol/L   Potassium 5.0 3.5 - 5.3 mmol/L   Chloride 101 98 - 110 mmol/L   CO2 28 20 - 32 mmol/L   Calcium  9.6 8.6 - 10.3 mg/dL      Assessment & Plan:   Problem List Items Addressed This Visit     Hyperlipidemia associated with type 2 diabetes mellitus (HCC)   Relevant Medications   tadalafil  (CIALIS ) 20 MG tablet   Other Relevant Orders   CT CARDIAC SCORING (SELF PAY ONLY)   Type 2  diabetes mellitus with other specified complication (HCC) - Primary   Relevant Orders   CT CARDIAC SCORING (SELF PAY ONLY)   Other Visit Diagnoses       Need for Streptococcus pneumoniae vaccination       Relevant Orders   Pneumococcal conjugate vaccine 20-valent (Completed)     Erectile dysfunction, unspecified erectile dysfunction type       Relevant Medications   tadalafil  (CIALIS ) 20 MG tablet     Long term current use of oral hypoglycemic drug            Type 2 diabetes mellitus with hyperglycemia A1c increased to 8.0%, indicating suboptimal glycemic control. CGM in use. glucose variability noted, with spikes in evening and morning. Emphasized lifestyle modifications for improved control. - Continue Trijardy  25 / 5 / 1000 mg. - Encourage dietary modifications with smaller, more frequent meals. - Monitor blood glucose trends and report significant changes.  Chronic kidney disease, stage 3a Last Nephrology visit 2022. [facility] Dr Rhesa Celeste. No longer active following, we are monitoring labs. Stage 3 CKD with GFR 49.8 mL/min/1.73 m, slightly decreased. Creatinine 1.38 mg/dL. No acute changes. - Monitor renal function with periodic GFR and creatinine measurements. - Consider return to Nephrology if needed in future  Erectile dysfunction Previous sildenafil trial ineffective. Discussed tadalafil  for longer duration. No contraindications. Explained potential side effects. - Prescribe tadalafil  20 mg as needed, one hour before sexual activity. Goodrx coupon Consider switch to Sildenafil if the  Tadalafil  not as effective  Wellness Visit Blood pressure controlled at 126/76 mmHg. Weight stable at 188 lbs.   - Administer Prevnar 20 vaccine.        Orders Placed This Encounter  Procedures   CT CARDIAC SCORING (SELF PAY ONLY)    Standing Status:   Future    Expiration Date:   10/27/2024    Preferred imaging location?:   [facility]   Pneumococcal conjugate vaccine 20-valent     Meds ordered this encounter  Medications   tadalafil  (CIALIS ) 20 MG tablet    Sig: Take 0.5-1 tablets (10-20 mg total) by mouth every other day as needed for erectile dysfunction.    Dispense:  30 tablet    Refill:  5    Follow up plan: Return for 6 month fasting lab > 1 week later Annual Physical.  Future labs ordered for 04/17/24   Domingo Friend, DO [facility] 10/28/2023, 9:03 AM

## 2023-10-28 NOTE — Patient Instructions (Addendum)
 Thank you for coming to the office today.  Prevnar-20 pneumonia vaccine, done for next several years.  Recent Labs    04/20/23 1021 10/21/23 0919  HGBA1C 7.7* 8.0*   Keep current medication for diabetes  No lung scan this year, we can do heart scan instead  Tadalafil  generic cialis  ordered half or whole pill 1 hour before need, can last up to 36 hours, repeat in 48 hours if need.  You have been referred for a Coronary Calcium  Score Cardiac CT Scan. This is a screening test for patients aged 42-50+ with cardiovascular risk factors or who are healthy but would be interested in Cardiovascular Screening for heart disease. Even if there is a family history of heart disease, this imaging can be useful. Typically it can be done every 5+ years or at a different timeline we agree on  The scan will look at the chest and mainly focus on the heart and identify early signs of calcium  build up or blockages within the heart arteries. It is not 100% accurate for identifying blockages or heart disease, but it is useful to help us  predict who may have some early changes or be at risk in the future for a heart attack or cardiovascular problem.  The results are reviewed by a Cardiologist and they will document the results. It should become available on MyChart. Typically the results are divided into percentiles based on other patients of the same demographic and age. So it will compare your risk to others similar to you. If you have a higher score >99 or higher percentile >75%tile, it is recommended to consider Statin cholesterol therapy and or referral to Cardiologist. I will try to help explain your results and if we have questions we can contact the Cardiologist.  You will be contacted for scheduling. Usually it is done at any imaging facility through [facility], [facility] or [facility].  The cost is $99 flat fee total and it does not go through insurance, so no  authorization is required.   DUE for FASTING BLOOD WORK (no food or drink after midnight before the lab appointment, only water or coffee without cream/sugar on the morning of)  SCHEDULE "Lab Only" visit in the morning at the clinic for lab draw in 6 MONTHS   - Make sure Lab Only appointment is at about 1 week before your next appointment, so that results will be available  For Lab Results, once available within 2-3 days of blood draw, you can can log in to MyChart online to view your results and a brief explanation. Also, we can discuss results at next follow-up visit.   Please schedule a Follow-up Appointment to: Return for 6 month fasting lab > 1 week later Annual Physical.  If you have any other questions or concerns, please feel free to call the office or send a message through MyChart. You may also schedule an earlier appointment if necessary.  Additionally, you may be receiving a survey about your experience at our office within a few days to 1 week by e-mail or mail. We value your feedback.  Domingo Friend, DO [facility], [place]

## 2023-11-01 ENCOUNTER — Ambulatory Visit
Admission: RE | Admit: 2023-11-01 | Discharge: 2023-11-01 | Disposition: A | Discharge status: Home / Self Care | Payer: Self-pay | Source: Ambulatory Visit | Attending: Family Medicine | Admitting: Family Medicine

## 2023-11-01 DIAGNOSIS — E1169 Type 2 diabetes mellitus with other specified complication: Secondary | ICD-10-CM | POA: Insufficient documentation

## 2023-11-01 DIAGNOSIS — E785 Hyperlipidemia, unspecified: Secondary | ICD-10-CM | POA: Insufficient documentation

## 2023-11-03 ENCOUNTER — Encounter: Payer: Self-pay | Admitting: Family Medicine

## 2023-11-04 ENCOUNTER — Other Ambulatory Visit: Payer: Self-pay | Admitting: Family Medicine

## 2023-11-04 DIAGNOSIS — R931 Abnormal findings on diagnostic imaging of heart and coronary circulation: Secondary | ICD-10-CM

## 2023-11-04 DIAGNOSIS — E1169 Type 2 diabetes mellitus with other specified complication: Secondary | ICD-10-CM

## 2023-11-16 ENCOUNTER — Ambulatory Visit: Payer: Self-pay | Admitting: Family Medicine

## 2023-12-28 DIAGNOSIS — I2510 Atherosclerotic heart disease of native coronary artery without angina pectoris: Secondary | ICD-10-CM | POA: Insufficient documentation

## 2023-12-28 NOTE — Progress Notes (Signed)
 Cardiology Office Note  Date:  12/31/2023   ID:  Jorge Mayer, DOB 1945-07-15, MRN [id number]  PCP:  Edman Marsa PARAS, DO   Chief Complaint  Patient presents with   New Patient (Initial Visit)    Referred by pcp for arthrosclerosis and heart murmur. Doing well.     HPI:  [name] is a 78 year old gentleman with past medical history of  Essential hypertension Kidney disease Diabetes type 2 Aortic atherosclerosis CVA in 2003, right side deficits Who presents by referral from Dr.karamalegos for elevated calcium  score, diabetes, hyperlipidemia Atrial fibrillation with RVR  From [place] , moved to the area 4 years ago  CT calcium  score November 01, 2023 with aortic atherosclerosis, large hiatal hernia, small pleural effusions Calcium  score 581 predominantly LAD and left circumflex  No old EKG available EKG performed today showing atrial fibrillation with RVR rate 133 bpm  He reports that he is asymptomatic from atrial fibrillation, unclear when he went into atrial fibrillation Reports he is active, works in the garden, able to work for about an hour before he needs to stop and rest  He denies significant PND orthopnea, no leg swelling, no chest pain Denies any history of bleeding  Echocardiogram performed 2023 was grossly normal with normal ejection fraction  EKG personally reviewed by myself on todays visit EKG Interpretation Date/Time:  Friday December 31 2023 09:01:24 EDT Ventricular Rate:  133 PR Interval:    QRS Duration:  68 QT Interval:  282 QTC Calculation: 419 R Axis:   -8  Text Interpretation: Atrial fibrillation with rapid ventricular response Possible Inferior infarct , age undetermined No previous ECGs available Confirmed by Perla Lye [phone]) on 12/31/2023 9:21:26 AM    PMH:   has a past medical history of Actinic keratosis, Chronic kidney disease, Dysplastic nevus (03/12/2021), GERD (gastroesophageal reflux disease), Hypertension,  Prostate disease, Squamous cell carcinoma of skin, Stroke (HCC), and Urinary incontinence.  PSH:    Past Surgical History:  Procedure Laterality Date   CATARACT EXTRACTION Bilateral 2024   SHOULDER SURGERY     Right side    Current Outpatient Medications  Medication Sig Dispense Refill   atorvastatin  (LIPITOR) 10 MG tablet TAKE 1 TABLET BY MOUTH EVERYDAY AT BEDTIME 90 tablet 2   Cholecalciferol (D3-1000) 25 MCG (1000 UT) tablet Take 1,000 Units by mouth daily.     Continuous Glucose Sensor (DEXCOM G7 SENSOR) MISC USE TO CHECK BLOOD SUGAR.  CHANGE SENSOR EVERY 10     DAYS. 9 each 1   D-MANNOSE PO Take by mouth.     esomeprazole  (NEXIUM ) 40 MG capsule Take 1 capsule (40 mg total) by mouth daily. 90 capsule 3   Magnesium  400 MG TABS Take by mouth.     TRIJARDY  XR 25-11-998 MG TB24 TAKE 1 TABLET DAILY 90 tablet 3   tadalafil  (CIALIS ) 20 MG tablet Take 0.5-1 tablets (10-20 mg total) by mouth every other day as needed for erectile dysfunction. 30 tablet 5   No current facility-administered medications for this visit.     Allergies:   Patient has no known allergies.   Social History:  The patient  reports that he quit smoking about 49 years ago. His smoking use included cigarettes. He started smoking about 59 years ago. He has a 10 pack-year smoking history. He has never used smokeless tobacco. He reports current alcohol use of about 1.0 standard drink of alcohol per week. He reports that he does not use drugs.   Family History:   family  history includes Heart disease in his father and mother; Lung cancer (age of onset: 34) in his brother.    Review of Systems: Review of Systems  Constitutional: Negative.   HENT: Negative.    Respiratory: Negative.    Cardiovascular: Negative.   Gastrointestinal: Negative.   Musculoskeletal: Negative.   Neurological: Negative.   Psychiatric/Behavioral: Negative.    All other systems reviewed and are negative.   PHYSICAL EXAM: VS:  BP 118/78 (BP  Location: Left Arm, Patient Position: Sitting, Cuff Size: Normal)   Pulse (!) 133   Ht 5' 10 (1.778 m)   Wt 185 lb 6 oz (84.1 kg)   SpO2 97%   BMI 26.60 kg/m  , BMI Body mass index is 26.6 kg/m. GEN: Well nourished, well developed, in no acute distress HEENT: normal Neck: no JVD, carotid bruits, or masses Cardiac: RRR; no murmurs, rubs, or gallops,no edema  Respiratory:  clear to auscultation bilaterally, normal work of breathing GI: soft, nontender, nondistended, + BS MS: no deformity or atrophy Skin: warm and dry, no rash Neuro:  Strength and sensation are intact Psych: euthymic mood, full affect    Recent Labs: 04/20/2023: ALT 26; Hemoglobin 16.6; Platelets 366; TSH 2.05 10/21/2023: BUN 22; Creat 1.38; Potassium 5.0; Sodium 139    Lipid Panel Lab Results  Component Value Date   CHOL 152 04/20/2023   HDL 61 04/20/2023   LDLCALC 73 04/20/2023   TRIG 95 04/20/2023      Wt Readings from Last 3 Encounters:  12/31/23 185 lb 6 oz (84.1 kg)  10/28/23 188 lb 6.4 oz (85.5 kg)  04/22/23 187 lb (84.8 kg)       ASSESSMENT AND PLAN:  Problem List Items Addressed This Visit       Cardiology Problems   Coronary artery calcification - Primary   Relevant Orders   EKG 12-Lead (Completed)   Benign hypertension with CKD (chronic kidney disease) stage III (HCC)   Hyperlipidemia associated with type 2 diabetes mellitus (HCC)   Relevant Orders   EKG 12-Lead (Completed)   Atherosclerosis of aorta (HCC)     Other   Type 2 diabetes mellitus with other specified complication (HCC)   Stage 3a chronic kidney disease (HCC)   Heart murmur, systolic   Relevant Orders   EKG 12-Lead (Completed)   Atrial fibrillation with RVR Timing of onset unclear, recommended he start Eliquis 5 twice daily for stroke prevention -Suggested starting metoprolol succinate 50 daily with close monitoring of heart rate and blood pressure at home - We have ordered Zio monitor for further evaluation  whether he is paroxysmal or continuous A-fib - Echocardiogram 2023 essentially normal,  Could repeat study and follow-up if needed -Discussion in follow-up concerning rate control versus rhythm control  Coronary calcification Reports he is asymptomatic, denies anginal symptoms -Suggested he add Zetia 10 mg daily with his Lipitor 10 - He does report having myalgias on Lipitor 10 but was able to get used to it - Rather than increasing Lipitor with risk of worsening myalgias, we will add Zetia to achieve goal LDL less than 55 - For any chest pain or shortness of breath symptoms, felt unrelated to his atrial fibrillation, could consider ischemic workup  Diabetes type 2 with complications A1c trending upwards 8.0 Low carbohydrate diet recommended Managed by primary care  Essential hypertension Blood pressure relatively well-controlled today, metoprolol succinate 50 added for rate control  Signed, Velinda Lunger, M.D., Ph.D. [facility] [place], [place] [phone]

## 2023-12-30 ENCOUNTER — Ambulatory Visit: Payer: 59 | Admitting: Dermatology

## 2023-12-31 ENCOUNTER — Encounter: Payer: Self-pay | Admitting: Cardiovascular Disease

## 2023-12-31 ENCOUNTER — Ambulatory Visit

## 2023-12-31 ENCOUNTER — Ambulatory Visit: Attending: Cardiovascular Disease | Admitting: Cardiovascular Disease

## 2023-12-31 VITALS — BP 118/78 | HR 133 | Ht 70.0 in | Wt 185.4 lb

## 2023-12-31 DIAGNOSIS — I700 Atherosclerosis of aorta: Secondary | ICD-10-CM | POA: Insufficient documentation

## 2023-12-31 DIAGNOSIS — N1831 Chronic kidney disease, stage 3a: Secondary | ICD-10-CM | POA: Insufficient documentation

## 2023-12-31 DIAGNOSIS — E1169 Type 2 diabetes mellitus with other specified complication: Secondary | ICD-10-CM | POA: Diagnosis present

## 2023-12-31 DIAGNOSIS — I129 Hypertensive chronic kidney disease with stage 1 through stage 4 chronic kidney disease, or unspecified chronic kidney disease: Secondary | ICD-10-CM | POA: Insufficient documentation

## 2023-12-31 DIAGNOSIS — E785 Hyperlipidemia, unspecified: Secondary | ICD-10-CM | POA: Diagnosis present

## 2023-12-31 DIAGNOSIS — I4891 Unspecified atrial fibrillation: Secondary | ICD-10-CM

## 2023-12-31 DIAGNOSIS — R011 Cardiac murmur, unspecified: Secondary | ICD-10-CM | POA: Insufficient documentation

## 2023-12-31 DIAGNOSIS — I2510 Atherosclerotic heart disease of native coronary artery without angina pectoris: Secondary | ICD-10-CM | POA: Insufficient documentation

## 2023-12-31 DIAGNOSIS — N183 Chronic kidney disease, stage 3 unspecified: Secondary | ICD-10-CM | POA: Diagnosis present

## 2023-12-31 MED ORDER — METOPROLOL SUCCINATE ER 50 MG PO TB24: 50.0000 mg | ORAL_TABLET | Freq: Every day | ORAL | 3 refills | Status: DC

## 2023-12-31 MED ORDER — EZETIMIBE 10 MG PO TABS: 10.0000 mg | ORAL_TABLET | Freq: Every day | ORAL | 3 refills | Status: AC

## 2023-12-31 MED ORDER — APIXABAN 5 MG PO TABS: 5.0000 mg | ORAL_TABLET | Freq: Two times a day (BID) | ORAL | 3 refills | Status: DC

## 2023-12-31 NOTE — Patient Instructions (Addendum)
 Medication Instructions:   Please start eliquis 5 mg twice a day, blood thinner for afib Please start metoprolol succinate 50 mg daily, afib rate control Please start zetia 10 mg daily for cholesterol   If you need a refill on your cardiac medications before your next appointment, please call your pharmacy.   Lab work: No new labs needed  Testing/Procedures:  ZIO XT- Long Term Monitor Instructions  Your physician has requested you wear a ZIO patch monitor for 14 days.  This is a single patch monitor. [employer] supplies one patch monitor per enrollment. Additional stickers are not available. Please do not apply patch if you will be having a Nuclear Stress Test,  Echocardiogram, Cardiac CT, MRI, or Chest Xray during the period you would be wearing the  monitor. The patch cannot be worn during these tests. You cannot remove and re-apply the  ZIO XT patch monitor.  Your ZIO patch monitor will be mailed 3 day USPS to your address on file. It may take 3-5 days  to receive your monitor after you have been enrolled.  Once you have received your monitor, please review the enclosed instructions. Your monitor  has already been registered assigning a specific monitor serial # to you.  Billing and Patient Assistance Program Information  We have supplied [employer] with any of your insurance information on file for billing purposes. [employer] offers a sliding scale Patient Assistance Program for patients that do not have  insurance, or whose insurance does not completely cover the cost of the ZIO monitor.  You must apply for the Patient Assistance Program to qualify for this discounted rate.  To apply, please call [employer] at [phone], select option 4, select option 2, ask to apply for  Patient Assistance Program. Meredeth will ask your household income, and how many people  are in your household. They will quote your out-of-pocket cost based on that information.  [employer] will also be able to set up  a 35-month, interest-free payment plan if needed.  Applying the monitor   Shave hair from upper left chest.  Hold abrader disc by orange tab. Rub abrader in 40 strokes over the upper left chest as  indicated in your monitor instructions.  Clean area with 4 enclosed alcohol pads. Let dry.  Apply patch as indicated in monitor instructions. Patch will be placed under collarbone on left  side of chest with arrow pointing upward.  Rub patch adhesive wings for 2 minutes. Remove white label marked 1. Remove the white  label marked 2. Rub patch adhesive wings for 2 additional minutes.  While looking in a mirror, press and release button in center of patch. A small green light will  flash 3-4 times. This will be your only indicator that the monitor has been turned on.  Do not shower for the first 24 hours. You may shower after the first 24 hours.  Press the button if you feel a symptom. You will hear a small click. Record Date, Time and  Symptom in the Patient Logbook.  When you are ready to remove the patch, follow instructions on the last 2 pages of Patient  Logbook. Stick patch monitor onto the last page of Patient Logbook.  Place Patient Logbook in the blue and white box. Use locking tab on box and tape box closed  securely. The blue and white box has prepaid postage on it. Please place it in the mailbox as  soon as possible. Your physician should have your test results approximately 7  days after the  monitor has been mailed back to [place].  Call [facility] Customer Care at [phone] if you have questions regarding  your ZIO XT patch monitor. Call them immediately if you see an orange light blinking on your  monitor.  If your monitor falls off in less than 4 days, contact our Monitor department at [phone].  If your monitor becomes loose or falls off after 4 days call [employer] at [phone] for  suggestions on securing your monitor   Follow-Up: At [facility],  you and your health needs are our priority.  As part of our continuing mission to provide you with exceptional heart care, we have created designated Provider Care Teams.  These Care Teams include your primary Cardiologist (physician) and Advanced Practice Providers (APPs -  Physician Assistants and Nurse Practitioners) who all work together to provide you with the care you need, when you need it.  You will need a follow up appointment in 6 weeks after zio monitor  Providers on your designated Care Team:   Lonni Meager, NP Bernardino Bring, PA-C Cadence [place], [place]  COVID-19 Vaccine Information can be found at: [email or web address] For questions related to vaccine distribution or appointments, please email [email or web address] or call [phone].

## 2024-01-27 ENCOUNTER — Ambulatory Visit: Admitting: Dermatology

## 2024-01-27 DIAGNOSIS — Z8589 Personal history of malignant neoplasm of other organs and systems: Secondary | ICD-10-CM

## 2024-01-27 DIAGNOSIS — Z1283 Encounter for screening for malignant neoplasm of skin: Secondary | ICD-10-CM | POA: Diagnosis not present

## 2024-01-27 DIAGNOSIS — L814 Other melanin hyperpigmentation: Secondary | ICD-10-CM | POA: Diagnosis not present

## 2024-01-27 DIAGNOSIS — Z79899 Other long term (current) drug therapy: Secondary | ICD-10-CM

## 2024-01-27 DIAGNOSIS — L570 Actinic keratosis: Secondary | ICD-10-CM | POA: Diagnosis not present

## 2024-01-27 DIAGNOSIS — S70362A Insect bite (nonvenomous), left thigh, initial encounter: Secondary | ICD-10-CM | POA: Diagnosis not present

## 2024-01-27 DIAGNOSIS — W57XXXA Bitten or stung by nonvenomous insect and other nonvenomous arthropods, initial encounter: Secondary | ICD-10-CM

## 2024-01-27 DIAGNOSIS — D2372 Other benign neoplasm of skin of left lower limb, including hip: Secondary | ICD-10-CM

## 2024-01-27 DIAGNOSIS — D239 Other benign neoplasm of skin, unspecified: Secondary | ICD-10-CM

## 2024-01-27 DIAGNOSIS — W908XXA Exposure to other nonionizing radiation, initial encounter: Secondary | ICD-10-CM

## 2024-01-27 DIAGNOSIS — Z7189 Other specified counseling: Secondary | ICD-10-CM

## 2024-01-27 DIAGNOSIS — Z86018 Personal history of other benign neoplasm: Secondary | ICD-10-CM

## 2024-01-27 DIAGNOSIS — L578 Other skin changes due to chronic exposure to nonionizing radiation: Secondary | ICD-10-CM | POA: Diagnosis not present

## 2024-01-27 DIAGNOSIS — D229 Melanocytic nevi, unspecified: Secondary | ICD-10-CM

## 2024-01-27 MED ORDER — TRIAMCINOLONE ACETONIDE 0.1 % EX CREA: TOPICAL_CREAM | CUTANEOUS | 1 refills | Status: DC

## 2024-01-27 NOTE — Patient Instructions (Addendum)
 For bite at left thigh  Start triamcinolone  0.1 % cream use twice daily to affected area for up to 5 days a week as needed. If resolved can discontinue only use if flared. Avoid applying to face, groin, and axilla. Use as directed. Long-term use can cause thinning of the skin.  Topical steroids (such as triamcinolone , fluocinolone, fluocinonide, mometasone, clobetasol, halobetasol, betamethasone, hydrocortisone) can cause thinning and lightening of the skin if they are used for too long in the same area. Your physician has selected the right strength medicine for your problem and area affected on the body. Please use your medication only as directed by your physician to prevent side effects.      Actinic keratoses are precancerous spots that appear secondary to cumulative UV radiation exposure/sun exposure over time. They are chronic with expected duration over 1 year. A portion of actinic keratoses will progress to squamous cell carcinoma of the skin. It is not possible to reliably predict which spots will progress to skin cancer and so treatment is recommended to prevent development of skin cancer.  Recommend daily broad spectrum sunscreen SPF 30+ to sun-exposed areas, reapply every 2 hours as needed.  Recommend staying in the shade or wearing long sleeves, sun glasses (UVA+UVB protection) and wide brim hats (4-inch brim around the entire circumference of the hat). Call for new or changing lesions.   Cryotherapy Aftercare  Wash gently with soap and water everyday.   Apply Vaseline and Band-Aid daily until healed.     Melanoma ABCDEs  Melanoma is the most dangerous type of skin cancer, and is the leading cause of death from skin disease.  You are more likely to develop melanoma if you: Have light-colored skin, light-colored eyes, or red or blond hair Spend a lot of time in the sun Tan regularly, either outdoors or in a tanning bed Have had blistering sunburns, especially during  childhood Have a close family member who has had a melanoma Have atypical moles or large birthmarks  Early detection of melanoma is key since treatment is typically straightforward and cure rates are extremely high if we catch it early.   The first sign of melanoma is often a change in a mole or a new dark spot.  The ABCDE system is a way of remembering the signs of melanoma.  A for asymmetry:  The two halves do not match. B for border:  The edges of the growth are irregular. C for color:  A mixture of colors are present instead of an even brown color. D for diameter:  Melanomas are usually (but not always) greater than 6mm - the size of a pencil eraser. E for evolution:  The spot keeps changing in size, shape, and color.  Please check your skin once per month between visits. You can use a small mirror in front and a large mirror behind you to keep an eye on the back side or your body.   If you see any new or changing lesions before your next follow-up, please call to schedule a visit.  Please continue daily skin protection including broad spectrum sunscreen SPF 30+ to sun-exposed areas, reapplying every 2 hours as needed when you're outdoors.   Staying in the shade or wearing long sleeves, sun glasses (UVA+UVB protection) and wide brim hats (4-inch brim around the entire circumference of the hat) are also recommended for sun protection.    Due to recent changes in healthcare laws, you may see results of your pathology and/or laboratory studies  on MyChart before the doctors have had a chance to review them. We understand that in some cases there may be results that are confusing or concerning to you. Please understand that not all results are received at the same time and often the doctors may need to interpret multiple results in order to provide you with the best plan of care or course of treatment. Therefore, we ask that you please give us  2 business days to thoroughly review all your  results before contacting the office for clarification. Should we see a critical lab result, you will be contacted sooner.   If You Need Anything After Your Visit  If you have any questions or concerns for your doctor, please call our main line at [phone] and press option 4 to reach your doctor's medical assistant. If no one answers, please leave a voicemail as directed and we will return your call as soon as possible. Messages left after 4 pm will be answered the following business day.   You may also send us  a message via MyChart. We typically respond to MyChart messages within 1-2 business days.  For prescription refills, please ask your pharmacy to contact our office. Our fax number is [phone].  If you have an urgent issue when the clinic is closed that cannot wait until the next business day, you can page your doctor at the number below.    Please note that while we do our best to be available for urgent issues outside of office hours, we are not available 24/7.   If you have an urgent issue and are unable to reach us , you may choose to seek medical care at your doctor's office, retail clinic, urgent care center, or emergency room.  If you have a medical emergency, please immediately call 911 or go to the emergency department.  Pager Numbers  - Dr. Hester: [phone]  - Dr. Jackquline: [phone]  - Dr. Claudene: [phone]   In the event of inclement weather, please call our main line at [phone] for an update on the status of any delays or closures.  Dermatology Medication Tips: Please keep the boxes that topical medications come in in order to help keep track of the instructions about where and how to use these. Pharmacies typically print the medication instructions only on the boxes and not directly on the medication tubes.   If your medication is too expensive, please contact our office at [phone] option 4 or send us  a message through MyChart.   We are  unable to tell what your co-pay for medications will be in advance as this is different depending on your insurance coverage. However, we may be able to find a substitute medication at lower cost or fill out paperwork to get insurance to cover a needed medication.   If a prior authorization is required to get your medication covered by your insurance company, please allow us  1-2 business days to complete this process.  Drug prices often vary depending on where the prescription is filled and some pharmacies may offer cheaper prices.  The website www.goodrx.com contains coupons for medications through different pharmacies. The prices here do not account for what the cost may be with help from insurance (it may be cheaper with your insurance), but the website can give you the price if you did not use any insurance.  - You can print the associated coupon and take it with your prescription to the pharmacy.  - You may also stop by our office during  regular business hours and pick up a GoodRx coupon card.  - If you need your prescription sent electronically to a different pharmacy, notify our office through [facility] or by phone at [phone] option 4.     Si Usted Necesita Algo Despus de Su Visita  Tambin puede enviarnos un mensaje a travs de Clinical cytogeneticist. Por lo general respondemos a los mensajes de MyChart en el transcurso de 1 a 2 das hbiles.  Para renovar recetas, por favor pida a su farmacia que se ponga en contacto con nuestra oficina. Randi lakes de fax es [place] [phone].  Si tiene un asunto urgente cuando la clnica est cerrada y que no puede esperar hasta el siguiente da hbil, puede llamar/localizar a su doctor(a) al nmero que aparece a continuacin.   Por favor, tenga en cuenta que aunque hacemos todo lo posible para estar disponibles para asuntos urgentes fuera del horario de [place], no estamos disponibles las 24 horas del da, los 7 [address]  [address] de la [place].   Si tiene un  problema urgente y no puede comunicarse con nosotros, puede optar por buscar atencin mdica  en el consultorio de su doctor(a), en una clnica privada, en un centro de atencin urgente o en una sala de emergencias.  Si tiene Engineer, drilling, por favor llame inmediatamente al 911 o vaya a la sala de emergencias.  Nmeros de bper  - Dr. Hester: [phone]  - Dra. Jackquline: [phone]  - Dr. Claudene: [phone]   En caso de inclemencias del tiempo, por favor llame a landry capes principal al [phone] para una actualizacin sobre el [place] de cualquier retraso o cierre.  Consejos para la medicacin en dermatologa: Por favor, guarde las cajas en las que vienen los medicamentos de uso tpico para ayudarle a seguir las instrucciones sobre dnde y cmo usarlos. Las farmacias generalmente imprimen las instrucciones del medicamento slo en las cajas y no directamente en los tubos del [place].   Si su medicamento es muy caro, por favor, pngase en contacto con landry rieger llamando al [phone] y presione la opcin 4 o envenos un mensaje a travs de Clinical cytogeneticist.   No podemos decirle cul ser su copago por los medicamentos por adelantado ya que esto es diferente dependiendo de la cobertura de su seguro. Sin embargo, es posible que podamos encontrar un medicamento sustituto a Audiological scientist un formulario para que el seguro cubra el medicamento que se considera necesario.   Si se requiere una autorizacin previa para que su compaa de seguros malta su medicamento, por favor permtanos de 1 a 2 das hbiles para completar este proceso.  Los precios de los medicamentos varan con frecuencia dependiendo del Environmental consultant de dnde se surte la receta y alguna farmacias pueden ofrecer precios ms baratos.  El sitio web www.goodrx.com tiene cupones para medicamentos de Health and safety inspector. Los precios aqu no tienen en cuenta lo que podra costar con la ayuda del seguro (puede ser ms  barato con su seguro), pero el sitio web puede darle el precio si no utiliz Tourist information centre manager.  - Puede imprimir el cupn correspondiente y llevarlo con su receta a la farmacia.  - Tambin puede pasar por nuestra oficina durante el horario de atencin regular y Education officer, museum una tarjeta de cupones de GoodRx.  - Si necesita que su receta se enve electrnicamente a una farmacia diferente, informe a nuestra oficina a travs de MyChart de [place] o por telfono llamando al [phone] y presione la opcin 4.

## 2024-01-27 NOTE — Progress Notes (Signed)
 Follow-Up Visit   Subjective  Jorge Mayer is a 78 y.o. male who presents for the following: Skin Cancer Screening and Full Body Skin Exam Hx of scc , hx of dysplastic nevi, hx of aks and isks,   Used 5 f/u calcipotriene cream in July on scalp. Patient reports he used and got inflamed. Still feeling some rough spots at scalp and forehead  The patient presents for Total-Body Skin Exam (TBSE) for skin cancer screening and mole check. The patient has spots, moles and lesions to be evaluated, some may be new or changing and the patient may have concern these could be cancer.  The following portions of the chart were reviewed this encounter and updated as appropriate: medications, allergies, medical history  Review of Systems:  No other skin or systemic complaints except as noted in HPI or Assessment and Plan.  Objective  Well appearing patient in no apparent distress; mood and affect are within normal limits.  A full examination was performed including scalp, head, eyes, ears, nose, lips, neck, chest, axillae, abdomen, back, buttocks, bilateral upper extremities, bilateral lower extremities, hands, feet, fingers, toes, fingernails, and toenails. All findings within normal limits unless otherwise noted below.   Relevant physical exam findings are noted in the Assessment and Plan.  scalp and face x 16 (16) Erythematous thin papules/macules with gritty scale.   Assessment & Plan    SKIN CANCER SCREENING PERFORMED TODAY.  ACTINIC DAMAGE - Chronic condition, secondary to cumulative UV/sun exposure - diffuse scaly erythematous macules with underlying dyspigmentation - Recommend daily broad spectrum sunscreen SPF 30+ to sun-exposed areas, reapply every 2 hours as needed.  - Staying in the shade or wearing long sleeves, sun glasses (UVA+UVB protection) and wide brim hats (4-inch brim around the entire circumference of the hat) are also recommended for sun protection.  - Call for new or  changing lesions.  LENTIGINES, SEBORRHEIC KERATOSES, HEMANGIOMAS - Benign normal skin lesions - Benign-appearing - Call for any changes  MELANOCYTIC NEVI - Tan-brown and/or pink-flesh-colored symmetric macules and papules - Benign appearing on exam today - Observation - Call clinic for new or changing moles - Recommend daily use of broad spectrum spf 30+ sunscreen to sun-exposed areas.   DERMATOFIBROMA At left posterior thigh  Exam: Firm pink/brown papulenodule with dimple sign. Treatment Plan: A dermatofibroma is a benign growth possibly related to trauma, such as an insect bite, cut from shaving, or inflamed acne-type bump.  Treatment options to remove include shave or excision with resulting scar and risk of recurrence.  Since benign-appearing and not bothersome, will observe for now.   HISTORY OF SQUAMOUS CELL CARCINOMA OF THE SKIN Scalp and forehead treated in [place]   - No evidence of recurrence today - No lymphadenopathy - Recommend regular full body skin exams - Recommend daily broad spectrum sunscreen SPF 30+ to sun-exposed areas, reapply every 2 hours as needed.  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  Actinic keratoses are precancerous spots that appear secondary to cumulative UV radiation exposure/sun exposure over time. They are chronic with expected duration over 1 year. A portion of actinic keratoses will  progress to squamous cell carcinoma of the skin. It is not possible to reliably predict which spots will progress to skin cancer and so treatment is recommended to prevent development of skin cancer.  Recommend daily broad spectrum sunscreen SPF 30+ to sun-exposed areas, reapply every 2 hours as needed.  Recommend staying in the shade or wearing long sleeves, sun glasses (UVA+UVB protection) and wide brim hats (4-inch brim around the entire circumference of the hat). Call for new or changing lesions. Destruction of lesion - scalp and face x 16 (16) Complexity: simple   Destruction method: cryotherapy   Informed consent: discussed and consent obtained   Timeout:  patient name, date of birth, surgical site, and procedure verified Lesion destroyed using liquid nitrogen: Yes   Region frozen until ice ball extended beyond lesion: Yes   Outcome: patient tolerated procedure well with no complications   Post-procedure details: wound care instructions given    INSECT BITE OF LEFT THIGH WITH LOCAL REACTION, INITIAL ENCOUNTER  Bite reaction at left posterior thigh Exam: pink patch with excoriations at left posterior thigh  Treatment Plan: Hx of bug bites Start tmc 0.1 % cream - apply twice daily to aa 5 days a week as needed.  Avoid applying to face, groin, and axilla. Use as directed. Long-term use can cause thinning of the skin. 15g 1 rf  Topical steroids (such as triamcinolone , fluocinolone, fluocinonide, mometasone, clobetasol, halobetasol, betamethasone, hydrocortisone) can cause thinning and lightening of the skin if they are used for too long in the same area. Your physician has selected the right strength medicine for your problem and area affected on the body. Please use your medication only as directed by your physician to prevent side effects.  Related Medications triamcinolone  cream (KENALOG ) 0.1 % Apply topically to itchy rash at left thigh from bug bite twice daily up to 5 days a week  as needed.Avoid applying to face, groin, and axilla. Use as directed. Return in about 1 year (around 01/26/2025) for TBSE.  IEleanor Blush, CMA, am acting as scribe for Alm Rhyme, MD.   Documentation: I have reviewed the above documentation for accuracy and completeness, and I agree with the above.  Alm Rhyme, MD

## 2024-02-01 ENCOUNTER — Encounter: Payer: Self-pay | Admitting: Dermatology

## 2024-02-02 ENCOUNTER — Ambulatory Visit: Payer: Self-pay

## 2024-02-02 NOTE — Telephone Encounter (Signed)
 FYI Only or Action Required?: FYI only for provider.  Patient was last seen in primary care on 10/28/2023 by Edman Marsa PARAS, DO.  Called Nurse Triage reporting Wheezing.  Symptoms began several days ago.  Interventions attempted: Nothing.  Symptoms are: rapidly worsening. Patient says he feels like he is in Afib and is experiencing chest tightness along with expiratory wheezing  Triage Disposition: Go to ED Now (Notify PCP)  Patient/caregiver understands and will follow disposition?: Yes Copied from CRM [id number]. Topic: Clinical - Red Word Triage >> Feb 02, 2024  3:24 PM [name] wrote: Red Word that prompted transfer to Nurse Triage: Wheezing, totally fatigued Reason for Disposition  Chest pain  (Exception: MILD central chest pain, present only when coughing.)  Answer Assessment - Initial Assessment Questions 1. ONSET: When did the cough begin?      2 days  2. SEVERITY: How bad is the cough today?      Got really bad last night, wheezing  on expiration  3. SPUTUM: Describe the color of your sputum (e.g., none, dry cough; clear, white, yellow, green)     Dry cough  4. HEMOPTYSIS: Are you coughing up any blood? If Yes, ask: How much? (e.g., flecks, streaks, tablespoons, etc.)     No  5. DIFFICULTY BREATHING: Are you having difficulty breathing? If Yes, ask: How bad is it? (e.g., mild, moderate, severe)      A little shortness of breath; gets winded when walking around  6. FEVER: Do you have a fever? If Yes, ask: What is your temperature, how was it measured, and when did it start?     No fever  7. CARDIAC HISTORY: Do you have any history of heart disease? (e.g., heart attack, congestive heart failure)      A. Fib  8. LUNG HISTORY: Do you have any history of lung disease?  (e.g., pulmonary embolus, asthma, emphysema)     No  9. PE RISK FACTORS: Do you have a history of blood clots? (or: recent major surgery, recent prolonged travel,  bedridden)     No  10. OTHER SYMPTOMS: Do you have any other symptoms? (e.g., runny nose, wheezing, chest pain)       Chest tightness and wheezing  11. PREGNANCY: Is there any chance you are pregnant? When was your last menstrual period?       No  12. TRAVEL: Have you traveled out of the country in the last month? (e.g., travel history, exposures)       No  Protocols used: Cough - Acute Non-Productive-A-AH

## 2024-02-07 ENCOUNTER — Inpatient Hospital Stay: Admitting: Internal Medicine

## 2024-02-08 ENCOUNTER — Ambulatory Visit (INDEPENDENT_AMBULATORY_CARE_PROVIDER_SITE_OTHER): Admitting: Family Medicine

## 2024-02-08 ENCOUNTER — Encounter: Payer: Self-pay | Admitting: Family Medicine

## 2024-02-08 ENCOUNTER — Ambulatory Visit: Payer: Self-pay | Admitting: Cardiovascular Disease

## 2024-02-08 VITALS — BP 110/72 | HR 88 | Ht 70.0 in | Wt 180.2 lb

## 2024-02-08 DIAGNOSIS — I4891 Unspecified atrial fibrillation: Secondary | ICD-10-CM | POA: Diagnosis not present

## 2024-02-08 DIAGNOSIS — R0600 Dyspnea, unspecified: Secondary | ICD-10-CM

## 2024-02-08 DIAGNOSIS — I480 Paroxysmal atrial fibrillation: Secondary | ICD-10-CM

## 2024-02-08 NOTE — Patient Instructions (Addendum)
 Thank you for coming to the office today.  Finish last 2 days of Furosemide 20mg  Reassurance today, no obvious swelling and lungs are clear I do hear the A-Fib The heart monitor showed 65% paroxysmal episodic A Fib, this explains the fatigue. Recommend keep apt with Cardiologist, I will contact them to share updates, and ask if they can check the blood panel next week. Consider options for AFib treatment, maybe see the Electrophysiologist If you want a 2nd opinion before committing to treatment, that is okay we can refer if you need.  Please schedule a Follow-up Appointment to: Return if symptoms worsen or fail to improve.  If you have any other questions or concerns, please feel free to call the office or send a message through MyChart. You may also schedule an earlier appointment if necessary.  Additionally, you may be receiving a survey about your experience at our office within a few days to 1 week by e-mail or mail. We value your feedback.  Marsa Officer, DO [facility], [place]

## 2024-02-08 NOTE — Progress Notes (Signed)
 Subjective:    Patient ID: Jorge Mayer, male    DOB: Nov 29, 1945, 78 y.o.   MRN: [id number]  Jorge Mayer is a 78 y.o. male presenting on 02/08/2024 for Hospitalization Follow-up   HPI  Discussed the use of AI scribe software for clinical note transcription with the patient, who gave verbal consent to proceed.  History of Present Illness   Jorge Mayer is a 78 year old male with atrial fibrillation who presents with shortness of breath and fluid buildup.  ED FOLLOW-UP VISIT  Hospital/Location: [facility] Date of ED Visit: 02/02/24  Reason for Presenting to ED: Shortness of Breath  FOLLOW-UP  - ED provider note and record have been reviewed - Patient presents today about 6 days after recent ED visit.   Dyspnea and respiratory symptoms Recent acute worsening dyspnea shortness of breath with activities - Significant fatigue associated with breathing difficulties - No chest pain or palpitations reported  Peripheral edema and fluid overload - Swelling in the legs, mild  Hospital ED visit on 02/02/24 - Fluid accumulation identified on ultrasound of heart and lungs - Started on Lasix 20 mg daily for a seven-day course, with two days remaining - notable improvement overall - Still has some fatigue and drained feeling with activity but able to do more activity  Cardiac rhythm monitoring and history - History of atrial fibrillation - Currently taking metoprolol  for heart rate control - Recently finished ZIO patch monitor, results on chart today from Cardiology 65% in Atrial Fibrillation paroxysmal  - Previous episode years ago with abnormal EKG, cardiology referral, and month-long heart monitor that showed no significant issues - Upcoming cardiology appointment scheduled for August 13th  Psychosocial stressors - 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  Symptoms of fatigue dyspnea edema have improved  - New medications on discharge: Furosemide 20mg  x 7 day - Changes to current meds on discharge: none   I have reviewed the discharge medication list, and have reconciled the current and discharge medications today.       02/08/2024    4:05 PM 02/08/2024    4:04 PM 10/28/2023    8:49 AM  Depression screen PHQ 2/9  Decreased Interest 0 0 0  Down, Depressed, Hopeless 0 0 0  PHQ - 2 Score 0 0 0  Altered sleeping 1  1  Tired, decreased energy 2  1  Change in appetite 0  0  Feeling bad or failure about yourself  0  0  Trouble concentrating 0  0  Moving slowly or fidgety/restless 0  0  Suicidal thoughts 0  0  PHQ-9 Score 3  2  Difficult doing work/chores   Not difficult at all       02/08/2024    4:05 PM 10/28/2023    8:49 AM 04/22/2023    9:51 AM 10/14/2021    2:34 PM  GAD 7 : Generalized Anxiety Score  Nervous, Anxious, on Edge 0 1 0 0  Control/stop worrying 0 1 0 0  Worry too much - different things 0 0 0 0  Trouble relaxing 0 0 0 0  Restless 0 0 0 0  Easily annoyed or irritable 0 0 0 0  Afraid - awful might happen 0 0 0 0  Total GAD 7 Score 0 2 0 0  Anxiety Difficulty  Not difficult at all  Not difficult at all    Social  History   Tobacco Use   Smoking status: Former    Current packs/day: 0.00    Average packs/day: 1 pack/day for 10.0 years (10.0 ttl pk-yrs)    Types: Cigarettes    Start date: 07/06/1964    Quit date: 07/06/1974    Years since quitting: 49.6   Smokeless tobacco: Never  Vaping Use   Vaping status: Never Used  Substance Use Topics   Alcohol use: Yes    Alcohol/week: 1.0 standard drink of alcohol    Types: 1 Standard drinks or equivalent per week   Drug use: Never    Review of Systems Per HPI unless specifically indicated above     Objective:    BP 110/72 (BP Location: Right Arm, Patient Position: Sitting, Cuff Size: Normal)   Pulse 88   Ht 5' 10 (1.778 m)   Wt 180 lb 4 oz (81.8 kg)   SpO2  95%   BMI 25.86 kg/m   Wt Readings from Last 3 Encounters:  02/08/24 180 lb 4 oz (81.8 kg)  12/31/23 185 lb 6 oz (84.1 kg)  10/28/23 188 lb 6.4 oz (85.5 kg)    Physical Exam Vitals and nursing note reviewed.  Constitutional:      General: He is not in acute distress.    Appearance: He is well-developed. He is not diaphoretic.     Comments: Well-appearing, comfortable, cooperative  HENT:     Head: Normocephalic and atraumatic.  Eyes:     General:        Right eye: No discharge.        Left eye: No discharge.     Conjunctiva/sclera: Conjunctivae normal.  Neck:     Thyroid : No thyromegaly.  Cardiovascular:     Rate and Rhythm: Normal rate.     Pulses: Normal pulses.     Heart sounds: Normal heart sounds. No murmur heard.    Comments: Irregularly irregular Pulmonary:     Effort: Pulmonary effort is normal. No respiratory distress.     Breath sounds: Normal breath sounds. No wheezing or rales.  Musculoskeletal:        General: Normal range of motion.     Cervical back: Normal range of motion and neck supple.     Right lower leg: No edema.     Left lower leg: No edema.  Lymphadenopathy:     Cervical: No cervical adenopathy.  Skin:    General: Skin is warm and dry.     Findings: No erythema or rash.  Neurological:     Mental Status: He is alert and oriented to person, place, and time. Mental status is at baseline.  Psychiatric:        Behavior: Behavior normal.     Comments: Well groomed, good eye contact, normal speech and thoughts     XR Chest 2 views  Anatomical Region Laterality Modality  Chest right Computed Radiography   Impression  Prominent interstitial markings without evidence of pneumonia. This is a nonspecific finding as can be seen with interstitial pulmonary edema and/or small airway inflammation. Narrative  This result has an attachment that is not available. EXAM: XR CHEST 2 VIEWS ACCESSION: [id number] UN REPORT DATE: 02/02/2024 5:29  PM  CLINICAL INDICATION: CHEST PAIN ; Chest Pain    TECHNIQUE: PA and Lateral Chest Radiographs  COMPARISON: None  FINDINGS: Mild pulmonary interstitial prominence is noted throughout.. No focal consolidation. No pleural effusion or pneumothorax. The cardiac silhouette is normal in size. Thoracic aorta is tortuous with calcifications. Osseous  structures are within normal limits. Procedure Note  Jorge Cain, MD - 02/02/2024 Formatting of this note might be different from the original. EXAM: XR CHEST 2 VIEWS ACCESSION: [id number] UN REPORT DATE: 02/02/2024 5:29 PM  CLINICAL INDICATION: CHEST PAIN ; Chest Pain    TECHNIQUE: PA and Lateral Chest Radiographs  COMPARISON: None  FINDINGS: Mild pulmonary interstitial prominence is noted throughout.. No focal consolidation. No pleural effusion or pneumothorax. The cardiac silhouette is normal in size. Thoracic aorta is tortuous with calcifications. Osseous structures are within normal limits.  IMPRESSION: Prominent interstitial markings without evidence of pneumonia. This is a nonspecific finding as can be seen with interstitial pulmonary edema and/or small airway inflammation. Exam End: 02/02/24 16:51   Specimen Collected: 02/02/24 17:29 Last Resulted: 02/02/24 17:33  Received From: [facility]  Result Received: 02/04/24 12:14    Results for orders placed or performed in visit on 10/21/23  Hemoglobin A1c   Collection Time: 10/21/23  9:19 AM  Result Value Ref Range   Hgb A1c MFr Bld 8.0 (H) <5.7 %   Mean Plasma Glucose 183 mg/dL   eAG (mmol/L) 89.8 mmol/L  BASIC METABOLIC PANEL WITH GFR   Collection Time: 10/21/23  9:19 AM  Result Value Ref Range   Glucose, Bld 184 (H) 65 - 99 mg/dL   BUN 22 7 - 25 mg/dL   Creat 8.61 (H) 9.29 - 1.28 mg/dL   BUN/Creatinine Ratio 16 6 - 22 (calc)   Sodium 139 135 - 146 mmol/L   Potassium 5.0 3.5 - 5.3 mmol/L   Chloride 101 98 - 110 mmol/L   CO2 28 20 - 32 mmol/L   Calcium  9.6 8.6 -  10.3 mg/dL      Assessment & Plan:   Problem List Items Addressed This Visit   None Visit Diagnoses       Paroxysmal atrial fibrillation (HCC)    -  Primary   Relevant Medications   furosemide (LASIX) 20 MG tablet     Dyspnea, unspecified type            Paroxysmal atrial fibrillation with fluid overload and fatigue [facility] ED Visit 02/02/24 Recent episode with fluid overload and fatigue. Mild pulmonary congestion confirmed. Elevated ProBNP 1814 indicates cardiac strain He has prior ECHO 2023 with normal LVEF and mild reduced grade 1 diastolic dysfunction.  No conclusive diagnosis of Congestive Heart failure.  Current flare likely related to current atrial fibrillation burden, with paroxysmal symptoms  Symptoms improved with furosemide. Mild fatigue and irregular heart rhythm persist. Further Cardiology follow-up evaluation pending. 02/16/24  - Complete remaining two days of furosemide 20 mg daily. - 7 day total course. Not a long term rx - Attend cardiologist appointment on August 13th. - Request cardiologist to perform blood panel during next visit. Follow up Chemistry / BMET, given he has not yet completed furosemide and he has extensive bruising on arms from recent attempted venipuncture advised okay to wait until next week  - Discuss atrial fibrillation treatment options with cardiologist, including potential medication adjustments or electrophysiology consultation. Also they will discuss anticoagulation vs anti arrhythmia therapy - Continue beta blocker for rate control. - Avoid strenuous exercise; engage in mild activities only for now. - Monitor symptoms and report any worsening of shortness of breath or fatigue.       Ask Cardiology check BMET or ask on routing chart on 8/13 Cardiology, he still has 2 more days of Furosemide  No orders of the defined types were placed in this  encounter.   No orders of the defined types were placed in this encounter.   Follow up  plan: Return if symptoms worsen or fail to improve.  Marsa Officer, DO [facility] 02/08/2024, 4:17 PM

## 2024-02-16 ENCOUNTER — Ambulatory Visit: Attending: Physician Assistant | Admitting: Physician Assistant

## 2024-02-16 ENCOUNTER — Encounter: Payer: Self-pay | Admitting: Physician Assistant

## 2024-02-16 VITALS — BP 128/66 | HR 114 | Ht 70.0 in | Wt 184.2 lb

## 2024-02-16 DIAGNOSIS — I4819 Other persistent atrial fibrillation: Secondary | ICD-10-CM | POA: Diagnosis present

## 2024-02-16 DIAGNOSIS — E1169 Type 2 diabetes mellitus with other specified complication: Secondary | ICD-10-CM | POA: Insufficient documentation

## 2024-02-16 DIAGNOSIS — J90 Pleural effusion, not elsewhere classified: Secondary | ICD-10-CM | POA: Diagnosis present

## 2024-02-16 DIAGNOSIS — I700 Atherosclerosis of aorta: Secondary | ICD-10-CM | POA: Diagnosis not present

## 2024-02-16 DIAGNOSIS — E785 Hyperlipidemia, unspecified: Secondary | ICD-10-CM | POA: Insufficient documentation

## 2024-02-16 DIAGNOSIS — I10 Essential (primary) hypertension: Secondary | ICD-10-CM | POA: Diagnosis present

## 2024-02-16 DIAGNOSIS — N1831 Chronic kidney disease, stage 3a: Secondary | ICD-10-CM | POA: Insufficient documentation

## 2024-02-16 DIAGNOSIS — I2510 Atherosclerotic heart disease of native coronary artery without angina pectoris: Secondary | ICD-10-CM | POA: Diagnosis present

## 2024-02-16 MED ORDER — METOPROLOL SUCCINATE ER 50 MG PO TB24: 50.0000 mg | ORAL_TABLET | Freq: Two times a day (BID) | ORAL | 3 refills | Status: DC

## 2024-02-16 NOTE — Patient Instructions (Signed)
 Medication Instructions:  Your physician recommends the following medication changes.  HOLD: Lipitor for 1 month and let us  know how you're doing  INCREASE: Metoprolol  succinate 50 mg twice daily    *If you need a refill on your cardiac medications before your next appointment, please call your pharmacy*  Lab Work: Your provider would like for you to have following labs drawn today TSH, CBC, and BMeT.   If you have labs (blood work) drawn today and your tests are completely normal, you will receive your results only by: MyChart Message (if you have MyChart) OR A paper copy in the mail If you have any lab test that is abnormal or we need to change your treatment, we will call you to review the results.  Testing/Procedures: Your physician has requested that you have an echocardiogram. Echocardiography is a painless test that uses sound waves to create images of your heart. It provides your doctor with information about the size and shape of your heart and how well your heart's chambers and valves are working.   You may receive an ultrasound enhancing agent through an IV if needed to better visualize your heart during the echo. This procedure takes approximately one hour.  There are no restrictions for this procedure.  This will take place at [address] [facility] [address] [facility], [place] [address]  Please note: We ask at that you not bring children with you during ultrasound (echo/ vascular) testing. Due to room size and safety concerns, children are not allowed in the ultrasound rooms during exams. Our front office staff cannot provide observation of children in our lobby area while testing is being conducted. An adult accompanying a patient to their appointment will only be allowed in the ultrasound room at the discretion of the ultrasound technician under special circumstances. We apologize for any inconvenience.   Follow-Up: At [facility], you and your health  needs are our priority.  As part of our continuing mission to provide you with exceptional heart care, our providers are all part of one team.  This team includes your primary Cardiologist (physician) and Advanced Practice Providers or APPs (Physician Assistants and Nurse Practitioners) who all work together to provide you with the care you need, when you need it.  Your next appointment:   3 month(s)  Provider:   You may see Timothy Gollan, MD or Bernardino Bring, PA-C

## 2024-02-16 NOTE — Progress Notes (Signed)
 Cardiology Office Note    Date:  02/16/2024   ID:  Jorge Mayer, DOB [birth year], MRN [id number]  PCP:  Edman Marsa PARAS, DO  Cardiologist:  Evalene Lunger, MD  Electrophysiologist:  None   Chief Complaint: ED follow up  History of Present Illness:   Jorge Mayer is a 78 y.o. 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  Echo in 01/2022 showed an EF of 60 to 65%, no regional wall motion abnormalities, moderate concentric LVH, grade 1 diastolic dysfunction, normal RV systolic function and ventricular cavity size, mild aortic insufficiency, aortic valve sclerosis without evidence of stenosis, and an estimated right atrial pressure of 3 mmHg.  Calcium  score in 10/2023 of 581 which was the 62nd percentile with coronary calcium  involving the LAD and LCx noncardiac overread notable for large hiatal hernia, small pleural effusions, and aortic atherosclerosis.  In this setting, he was referred to cardiology and evaluated by Dr. Gollan as a new patient on 12/31/2023.  EKG at that time showed the patient was in A-fib with RVR with a rate of 133 bpm without prior EKG available for review.  Chronicity of A-fib was unclear.  He was started on apixaban  and Toprol -XL.  Zio patch in 12/2023 showed sinus rhythm with episodes of PAF with an overall A-fib burden of 65% with an average rate of 120 bpm with the longest episode lasting 8 days and 12 hours.  A-fib was present in activation and deactivation of device.  1 run of NSVT lasting 4 beats, 1 run of NSVT lasting 58 seconds with an average rate of 158 bpm, and rare atrial and ventricular ectopy.  He was seen in the [facility] ED on 02/02/2024 with exertional dyspnea and orthopnea with associated pleuritic chest discomfort and fatigue following A-fib diagnosis approximately 3  weeks prior.  Workup at that time included a proBNP of 1814, high-sensitivity troponin negative x 3, chest x-ray with mild pulmonary interstitial prominence noted throughout without focal consolidation, pleural effusion, or pneumothorax.  Thoracic aorta was tortuous and with calcification.  EKG reported as sinus rhythm with PACs.  He was treated with IV Lasix in the ER and prescribed Lasix 20 mg daily as needed at time of discharge.  He followed up with his PCP on 02/08/2024 noting some improvement in fatigue, dyspnea, and edema.  He was taking furosemide 20 mg daily at that time (prescribed a 7-day course by the ED).  He comes in accompanied by his wife today and reports his breathing is improved following furosemide course as outlined above though not back to baseline.  He does report some exertional shortness of breath with activity such as walking in from the parking lot today.  He also reports a long history of intermittent palpitations, though these were typically short-lived and more recently have been longer lasting, at times lasting up to half a day.  No associated dizziness, presyncope, or syncope with these palpitations.  No frank chest pain.  He reports he was only taking 1 dose of apixaban  back when this was initially started in June, otherwise has not missed any doses of apixaban .  No falls or symptoms concerning for bleeding.  Wheezing, orthopnea, and dry cough have improved.  No significant lower extremity swelling, abdominal distention, or early satiety.  Continues to be under increased stress surrounding the move from  his current house.   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   atorvastatin  (LIPITOR) 10 MG tablet TAKE 1 TABLET BY MOUTH EVERYDAY AT BEDTIME   Cholecalciferol (D3-1000) 25 MCG (1000 UT) tablet Take 1,000 Units by mouth daily.   D-MANNOSE PO Take by mouth.   esomeprazole  (NEXIUM ) 40 MG capsule Take 1 capsule (40 mg total) by mouth daily.   ezetimibe  (ZETIA ) 10 MG tablet Take 1 tablet (10 mg total) by mouth daily.   Magnesium  400 MG TABS Take by mouth.   TRIJARDY  XR 25-11-998 MG TB24 TAKE 1 TABLET DAILY   [DISCONTINUED] metoprolol  succinate (TOPROL -XL) 50 MG 24 hr tablet Take 1 tablet (50 mg total) by mouth daily. Take with or immediately following a meal.    Allergies:   Patient has no known allergies.   Social History   Socioeconomic History   Marital status: Married    Spouse name: Not on file   Number of children: Not on file   Years of education: Not on file   Highest education level: Not on file  Occupational History   Not on file  Tobacco Use   Smoking status: Former    Current packs/day: 0.00    Average packs/day: 1 pack/day for 10.0 years (10.0 ttl pk-yrs)    Types: Cigarettes    Start date: 07/06/1964    Quit date: 07/06/1974    Years since quitting: 49.6   Smokeless tobacco: Never  Vaping Use   Vaping status: Never Used  Substance and Sexual Activity   Alcohol use: Yes    Alcohol/week: 1.0 standard drink of alcohol    Types: 1 Standard drinks or equivalent per week    Drug use: Never   Sexual activity: Not on file  Other Topics Concern   Not on file  Social History Narrative   Not on file   Social Drivers of Health   Financial Resource Strain: Not on file  Food Insecurity: Not on file  Transportation Needs: Not on file  Physical Activity: Not on file  Stress: Not on file  Social Connections: Not on file     Family History:  The patient's family history includes Heart disease in his father and mother; Lung cancer (age of onset: 86) in his brother.  ROS:   12-point review of systems is negative unless otherwise noted in the HPI.   EKGs/Labs/Other Studies Reviewed:    Studies reviewed were summarized above. The additional studies were reviewed today:  Zio patch 12/2023: Rhythm  is normal sinus rhythm with paroxysmal atrial fibrillation Patient had a min HR of 61 bpm, max HR of 192 bpm, and avg HR of 108 bpm.  Atrial Fibrillation occurred (65% burden), ranging from 65-192 bpm (avg of 120 bpm), the longest lasting 8 days 12 hours with an avg rate of 120 bpm. Atrial Fibrillation  was present at activation and de-activation of device.   1 run of Ventricular Tachycardia occurred lasting 4 beats with a max rate of 158 bpm (avg 150 bpm). 1 run of Supraventricular Tachycardia occurred lasting 58.0 secs with a max rate of 182 bpm (avg 158 bpm).    Isolated SVEs were rare (<1.0%), SVE Couplets were rare (<1.0%), and SVE Triplets were rare (<1.0%).    Isolated VEs were rare (<1.0%), VE Couplets were rare (<1.0%), and no VE Triplets were present. Ventricular Bigeminy was present.   No patient triggered events recorded __________  Calcium  score 11/01/2023: Ascending Aorta: Normal size   Pericardium: Normal   Coronary arteries: Normal origin of left and right coronary arteries. Distribution of arterial calcifications if present, as noted below;   LM 0   LAD 280   LCx 300   RCA 0   Total 581   IMPRESSION AND RECOMMENDATION: 1. Coronary  calcium  score of 581. This was 62nd percentile for age and sex matched control. 2. CAC >300 in LAD, LCx. CAC-DRS A3/N2. 3. Recommend aspirin and statin if no contraindication. 4. Recommend cardiology consultation. 5. Continue heart healthy lifestyle and risk factor modification.  Noncardiac overread: IMPRESSION: Large hiatal hernia.   Small pleural effusions.   Aortic Atherosclerosis __________  2D echo 01/13/2022: 1. Left ventricular ejection fraction, by estimation, is 60 to 65%. The  left ventricle has normal function. The left ventricle has no regional  wall motion abnormalities. There is moderate concentric left ventricular  hypertrophy. Left ventricular  diastolic parameters are consistent with Grade I diastolic dysfunction  (impaired relaxation). The average left ventricular global longitudinal  strain is -13.1 %.   2. Right ventricular systolic function is normal. The right ventricular  size is normal. Tricuspid regurgitation signal is inadequate for assessing  PA pressure.   3. The mitral valve is normal in structure. No evidence of mitral valve  regurgitation. No evidence of mitral stenosis.   4. The aortic valve is normal in structure. Aortic valve regurgitation is  mild. Aortic valve sclerosis is present, with no evidence of aortic valve  stenosis. Aortic valve area, by VTI measures 2.10 cm.   5. The inferior vena cava is normal in size with greater than 50%  respiratory variability, suggesting right atrial pressure of 3 mmHg.     EKG:  EKG is ordered today.  The EKG ordered today demonstrates Afib with RVR, 112 bpm, low voltage QRS, nonspecific ST/T changes  Recent Labs: 04/20/2023: ALT 26; Hemoglobin 16.6; Platelets 366; TSH 2.05 10/21/2023: BUN 22; Creat 1.38; Potassium 5.0; Sodium 139  Recent Lipid Panel    Component Value Date/Time   CHOL 152 04/20/2023 1021   TRIG 95 04/20/2023 1021   HDL 61 04/20/2023 1021   CHOLHDL 2.5 04/20/2023 1021   LDLCALC 73  04/20/2023 1021    PHYSICAL EXAM:    VS:  BP 128/66   Pulse (!) 114   Ht 5' 10 (1.778 m)   Wt 184 lb 3.2 oz (83.6 kg)   SpO2 96%   BMI 26.43 kg/m   BMI: Body mass index is 26.43 kg/m.  Physical Exam Constitutional:  Appearance: He is well-developed.  HENT:     Head: Normocephalic and atraumatic.  Eyes:     General:        Right eye: No discharge.        Left eye: No discharge.  Neck:     Vascular: No JVD.  Cardiovascular:     Rate and Rhythm: Tachycardia present. Rhythm irregularly irregular.     Heart sounds: Normal heart sounds, S1 normal and S2 normal. Heart sounds not distant. No midsystolic click and no opening snap. No murmur heard.    No friction rub.  Pulmonary:     Effort: Pulmonary effort is normal. No respiratory distress.     Breath sounds: Examination of the left-lower field reveals rales. Rales present. No decreased breath sounds, wheezing or rhonchi.  Abdominal:     General: There is no distension.     Palpations: Abdomen is soft.     Tenderness: There is no abdominal tenderness.  Musculoskeletal:     Cervical back: Normal range of motion.     Right lower leg: No edema.     Left lower leg: No edema.  Skin:    General: Skin is warm and dry.     Nails: There is no clubbing.  Neurological:     Mental Status: He is alert and oriented to person, place, and time.  Psychiatric:        Speech: Speech normal.        Behavior: Behavior normal.        Thought Content: Thought content normal.        Judgment: Judgment normal.     Wt Readings from Last 3 Encounters:  02/16/24 184 lb 3.2 oz (83.6 kg)  02/08/24 180 lb 4 oz (81.8 kg)  12/31/23 185 lb 6 oz (84.1 kg)     ASSESSMENT & PLAN:   Pleural effusion: Recently evaluated at outside ER for volume overload, likely exacerbated by persistent A-fib with RVR.  Dyspnea much improved, though not back to baseline, likely in the setting of continued paroxysms of A-fib with RVR.  Obtain echo to evaluate for  cardiomyopathy in the context of persistent A-fib.  Check BMP, BNP, and CBC.  Anticipate adding back furosemide based on follow-up labs, particularly while he is continuing to have a significant A-fib burden.  Persistent Afib: Currently in A-fib with RVR with rates in the 1 teens bpm.  We discussed escalation of rate control versus initiation of AAD and have elected to proceed with titration of Toprol -XL to 50 mg twice daily.  He will be referred to EP for further management of A-fib to assist in discussion of antiarrhythmic therapy and/or ablation.  CHADS2VASc at least 7 (HTN, age x 2, DM, CVA x 2, vascular disease).  Remains on apixaban  5 mg twice daily and does not meet reduced dosing criteria.  Check TSH, BMP, and CBC.  CAD involving the native coronary arteries without angina: No symptoms suggestive of angina.  On apixaban  in lieu of aspirin given A-fib.  Statin holiday as outlined below.  Obtain echo, if there is significant structural abnormality/cardiomyopathy anticipate ischemic testing with modality to be determined based on findings and renal function trend given underlying CKD.  Aortic atherosclerosis/HLD with possible statin intolerance: LDL 73 in 04/2023 with target LDL < 70.  Reports lower extremity myalgias on rosuvastatin .  Will undergo a trial of holding statin therapy to see how lower extremities trend in follow-up.  For now, continue with ezetimibe  10 mg.  CKD  stage IIIa: Check renal function following her recent outpatient diuresis.  HTN: Blood pressure is well-controlled in the office today.  Toprol -XL as outlined above.  History of CVA: No new deficits.  On apixaban  in place of aspirin given A-fib.  Statin holiday as outlined above.  Continue ezetimibe .  DM2: A1c 8.0.  Followed by PCP.    Disposition: F/u with Dr. Gollan or an APP in 3 months.   Medication Adjustments/Labs and Tests Ordered: Current medicines are reviewed at length with the patient today.  Concerns  regarding medicines are outlined above. Medication changes, Labs and Tests ordered today are summarized above and listed in the Patient Instructions accessible in Encounters.   Signed, Bernardino Bring, PA-C 02/16/2024 5:49 PM     [facility] [place] [address] [place], [place] [address] [phone]

## 2024-02-17 ENCOUNTER — Ambulatory Visit: Payer: Self-pay | Admitting: Physician Assistant

## 2024-02-17 DIAGNOSIS — Z79899 Other long term (current) drug therapy: Secondary | ICD-10-CM

## 2024-02-17 LAB — BASIC METABOLIC PANEL WITH GFR
BUN/Creatinine Ratio: 16 (ref 10–24)
BUN: 22 mg/dL (ref 8–27)
CO2: 21 mmol/L (ref 20–29)
Calcium: 9.7 mg/dL (ref 8.6–10.2)
Chloride: 101 mmol/L (ref 96–106)
Creatinine, Ser: 1.41 mg/dL — ABNORMAL HIGH (ref 0.76–1.27)
Glucose: 199 mg/dL — ABNORMAL HIGH (ref 70–99)
Potassium: 5.2 mmol/L (ref 3.5–5.2)
Sodium: 139 mmol/L (ref 134–144)
eGFR: 51 mL/min/1.73 — ABNORMAL LOW (ref >59)

## 2024-02-17 LAB — CBC
Hematocrit: 47.8 % (ref 37.5–51.0)
Hemoglobin: 15.3 g/dL (ref 13.0–17.7)
MCH: 28.3 pg (ref 26.6–33.0)
MCHC: 32 g/dL (ref 31.5–35.7)
MCV: 89 fL (ref 79–97)
Platelets: 428 x10E3/uL (ref 150–450)
RBC: 5.4 x10E6/uL (ref 4.14–5.80)
RDW: 13.6 % (ref 11.6–15.4)
WBC: 11 x10E3/uL — ABNORMAL HIGH (ref 3.4–10.8)

## 2024-02-17 LAB — TSH: TSH: 2 u[IU]/mL (ref 0.450–4.500)

## 2024-02-17 MED ORDER — FUROSEMIDE 20 MG PO TABS: 20.0000 mg | ORAL_TABLET | ORAL | 3 refills | Status: DC

## 2024-03-10 ENCOUNTER — Ambulatory Visit: Attending: Physician Assistant

## 2024-03-10 DIAGNOSIS — I4819 Other persistent atrial fibrillation: Secondary | ICD-10-CM | POA: Insufficient documentation

## 2024-03-10 LAB — ECHOCARDIOGRAM COMPLETE
AR max vel: 0.83 cm2
AV Area VTI: 0.82 cm2
AV Area mean vel: 0.83 cm2
AV Mean grad: 16 mmHg
AV Peak grad: 30.7 mmHg
Ao pk vel: 2.77 m/s
Calc EF: 51.2 %
MV VTI: 3.2 cm2
P 1/2 time: 495 ms
S' Lateral: 2.7 cm
Single Plane A2C EF: 50 %
Single Plane A4C EF: 50.8 %

## 2024-03-14 NOTE — Progress Notes (Unsigned)
 Cardiology Office Note    Date:  03/14/2024   ID:  Jorge Mayer, DOB 07/17/1945, MRN [id number]  PCP:  Jorge Marsa PARAS, DO  Cardiologist:  Jorge Lunger, MD  Electrophysiologist:  None   Chief Complaint: Follow up  History of Present Illness:   Jorge Mayer is a 78 y.o. 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  Echo in 01/2022 showed an EF of 60 to 65%, no regional wall motion abnormalities, moderate concentric LVH, grade 1 diastolic dysfunction, normal RV systolic function and ventricular cavity size, mild aortic insufficiency, aortic valve sclerosis without evidence of stenosis, and an estimated right atrial pressure of 3 mmHg.   Calcium  score in 10/2023 of 581 which was the 62nd percentile with coronary calcium  involving the LAD and LCx noncardiac overread notable for large hiatal hernia, small pleural effusions, and aortic atherosclerosis.  In this setting, he was referred to cardiology and evaluated by Dr. Gollan as a new patient on 12/31/2023.  EKG at that time showed the patient was in A-fib with RVR with a rate of 133 bpm without prior EKG available for review.  Chronicity of A-fib was unclear.  He was started on apixaban  and Toprol -XL.  Zio patch in 12/2023 showed sinus rhythm with episodes of PAF with an overall A-fib burden of 65% with an average rate of 120 bpm with the longest episode lasting 8 days and 12 hours.  A-fib was present in activation and deactivation of device.  1 run of NSVT lasting 4 beats, 1 run of NSVT lasting 58 seconds with an average rate of 158 bpm, and rare atrial and ventricular ectopy.   He was seen in the [facility] ED on 02/02/2024 with exertional dyspnea and orthopnea with associated  pleuritic chest discomfort and fatigue following A-fib diagnosis approximately 3 weeks prior.  Workup at that time included a proBNP of 1814, high-sensitivity troponin negative x 3, chest x-ray with mild pulmonary interstitial prominence noted throughout without focal consolidation, pleural effusion, or pneumothorax.  Thoracic aorta was tortuous and with calcification.  EKG reported as sinus rhythm with PACs.  He was treated with IV Lasix  in the ER and prescribed Lasix  20 mg daily as needed at time of discharge.  He followed up with his PCP on 02/08/2024 noting some improvement in fatigue, dyspnea, and edema.  He was taking furosemide  20 mg daily at that time (prescribed a 7-day course by the ED).  He was last seen in the office on 02/16/2024 and was in A-fib with RVR with rates in the 1-teens bpm with recommendation to titrate Toprol -XL to 50 mg.  He was referred to EP.  Dyspnea was much improved, though not at baseline.  After follow-up labs he was started on furosemide  20 mg every other day.  Echo on 03/10/2024 showed an EF of 55 to 60%, LVH, significant beat-to-beat variability with A-fib, low normal RV systolic function, normal ventricular cavity size, mildly elevated RVSP estimated at 40.7 mmHg, mild mitral and tricuspid regurgitation, mild to moderate aortic insufficiency with paradoxical low-flow low gradient with at least moderate aortic stenosis.  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    Allergies:   Patient has no known allergies.   Social History   Socioeconomic History   Marital status: Married    Spouse name: Not on file   Number of children: Not on file   Years of education: Not on file   Highest education level: Not on file  Occupational History   Not on file  Tobacco Use   Smoking status: Former    Current packs/day: 0.00    Average packs/day: 1 pack/day for 10.0 years (10.0 ttl pk-yrs)    Types: Cigarettes    Start date: 07/06/1964    Quit date: 07/06/1974    Years since quitting: 49.7   Smokeless tobacco: Never  Vaping Use   Vaping status: Never Used  Substance and Sexual Activity   Alcohol use: Yes    Alcohol/week: 1.0 standard drink of alcohol    Types: 1 Standard drinks or equivalent per week   Drug use: Never   Sexual activity: Not on file  Other Topics Concern   Not on file  Social History Narrative   Not on file   Social Drivers of Health   Financial Resource Strain: Not on file  Food Insecurity: Not on file  Transportation Needs: Not on file  Physical Activity: Not on file  Stress: Not on file  Social Connections: Not on file     Family History:  The patient's family history includes Heart disease in his father and mother; Lung cancer (age of onset: 14) in his brother.  ROS:   12-point review of systems is negative unless otherwise noted in the HPI.   EKGs/Labs/Other Studies Reviewed:    Studies reviewed were summarized above. The additional studies were reviewed today:  2D echo 03/10/2024: 1. Left ventricular ejection fraction, by estimation, is 55 to 60%.  The  left ventricle has normal function. There is moderate left ventricular  hypertrophy. Left ventricular diastolic parameters are indeterminate.  Significant beat-to-beat variability in  atrial fibrillation.   2. Right ventricular systolic function is low normal. The right  ventricular size is normal. There is mildly elevated pulmonary artery  systolic pressure.   3. Mild mitral valve regurgitation.   4. Mild tricuspid regurgitation.   5. Aortic valve regurgitation is mild to moderate. There is paradoxical,  low flow-low gradient aortic stenosis which is at least moderate in  severity. AVA 0.8-0.9, Vmax 2.8 cm/s, MG 16 mmHg with DVI of 0.32.   Comparison(s): When compared to prior study dated 01/13/2022, there is now  evidence of moderate aortic stenosis.   Conclusion(s)/Recommendation(s): Due to findings of moderate aortic  stenosis, a follow up echocardiogram is recommended in 1  year or sooner if  clinically indicated.  __________  Zio patch 12/2023: Rhythm is normal sinus rhythm with paroxysmal atrial fibrillation Patient had a min HR of 61 bpm, max HR of 192 bpm, and avg HR of 108 bpm.  Atrial Fibrillation occurred (65% burden), ranging from 65-192 bpm (avg of 120 bpm), the longest lasting 8 days 12 hours with an avg rate of 120 bpm. Atrial Fibrillation  was present at activation and de-activation of device.   1 run of Ventricular Tachycardia occurred lasting 4 beats with a max rate of 158 bpm (avg 150 bpm). 1 run of Supraventricular Tachycardia occurred lasting 58.0 secs with a max rate of 182 bpm (avg 158 bpm).    Isolated SVEs were rare (<1.0%), SVE Couplets were rare (<1.0%), and SVE Triplets were rare (<1.0%).    Isolated VEs were rare (<1.0%), VE Couplets were rare (<1.0%), and no VE Triplets were present. Ventricular Bigeminy was present.   No patient triggered events recorded __________   Calcium  score 11/01/2023: Ascending Aorta: Normal size   Pericardium: Normal    Coronary arteries: Normal origin of left and right coronary arteries. Distribution of arterial calcifications if present, as noted below;   LM 0   LAD 280   LCx 300   RCA 0   Total 581   IMPRESSION AND RECOMMENDATION: 1. Coronary calcium  score of 581. This was 62nd percentile for age and sex matched control. 2. CAC >300 in LAD, LCx. CAC-DRS A3/N2. 3. Recommend aspirin and statin if no contraindication. 4. Recommend cardiology consultation. 5. Continue heart healthy lifestyle and risk factor modification.   Noncardiac overread: IMPRESSION: Large hiatal hernia.   Small pleural effusions.   Aortic Atherosclerosis __________   2D echo 01/13/2022: 1. Left ventricular ejection fraction, by estimation, is 60 to 65%. The  left ventricle has normal function. The left ventricle has no regional  wall motion abnormalities. There is moderate concentric left ventricular  hypertrophy. Left ventricular  diastolic parameters are consistent with Grade I diastolic dysfunction  (impaired relaxation). The average left ventricular global longitudinal  strain is -13.1 %.   2. Right ventricular systolic function is normal. The right ventricular  size is normal. Tricuspid regurgitation signal is inadequate for assessing  PA pressure.   3. The mitral valve is normal in structure. No evidence of mitral valve  regurgitation. No evidence of mitral stenosis.   4. The aortic valve is normal in structure. Aortic valve regurgitation is  mild. Aortic valve sclerosis is present, with no evidence of aortic valve  stenosis. Aortic valve area, by VTI measures 2.10 cm.   5. The inferior vena cava is normal in size with greater than 50%  respiratory variability, suggesting right atrial pressure of 3 mmHg.   EKG:  EKG is ordered today.  The EKG ordered today demonstrates ***  Recent Labs: 04/20/2023: ALT 26 02/16/2024: BUN 22; Creatinine, Ser 1.41; Hemoglobin 15.3; Platelets 428; Potassium 5.2; Sodium  139; TSH 2.000  Recent Lipid Panel    Component Value Date/Time   CHOL 152 04/20/2023 1021   TRIG 95 04/20/2023 1021   HDL 61 04/20/2023 1021   CHOLHDL 2.5 04/20/2023 1021   LDLCALC 73 04/20/2023 1021    PHYSICAL EXAM:    VS:  There were no vitals taken for this visit.  BMI: There is no height or weight on file to calculate BMI.  Physical Exam  Wt Readings from Last 3 Encounters:  02/16/24 184 lb 3.2 oz (83.6 kg)  02/08/24 180  lb 4 oz (81.8 kg)  12/31/23 185 lb 6 oz (84.1 kg)     ASSESSMENT & PLAN:   HFpEF:  Persistent A-fib: ***.  CHADS2VASc at least 7 (HTN, age x 2, DM, CVA x 2, vascular disease).   CAD involving the native coronary arteries without angina:  Aortic stenosis:  Aortic atherosclerosis/HLD with possible statin intolerance: ***.  LDL 73 in 04/2023 Target LDL less than 70.  CKD stage IIIa:  HTN: Blood pressure  History of CVA:   {Are you ordering a CV Procedure (e.g. stress test, cath, DCCV, TEE, etc)?   Press F2        :[id number]}     Disposition: F/u with Dr. PIERRETTE or an APP in ***.   Medication Adjustments/Labs and Tests Ordered: Current medicines are reviewed at length with the patient today.  Concerns regarding medicines are outlined above. Medication changes, Labs and Tests ordered today are summarized above and listed in the Patient Instructions accessible in Encounters.   Signed, Bernardino Bring, PA-C 03/14/2024 10:45 AM     [facility] [place] [address] [place], [place] [address] [phone]

## 2024-03-14 NOTE — H&P (View-Only) (Signed)
 Cardiology Office Note    Date:  03/16/2024   ID:  Jorge Mayer, DOB 31-May-1946, MRN [id number]  PCP:  Edman Marsa PARAS, DO  Cardiologist:  Evalene Lunger, MD  Electrophysiologist:  None   Chief Complaint: Follow up  History of Present Illness:   Jorge Mayer is a 78 y.o. 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    Echo in 01/2022 showed an EF of 60 to 65%, no regional wall motion abnormalities, moderate concentric LVH, grade 1 diastolic dysfunction, normal RV systolic function and ventricular cavity size, mild aortic insufficiency, aortic valve sclerosis without evidence of stenosis, and an estimated right atrial pressure of 3 mmHg.   Calcium  score in 10/2023 of 581 which was the 62nd percentile with coronary calcium  involving the LAD and LCx noncardiac overread notable for large hiatal hernia, small pleural effusions, and aortic atherosclerosis.  In this setting, he was referred to cardiology and evaluated by Dr. Gollan as a new patient on 12/31/2023.  EKG at that time showed the patient was in A-fib with RVR with a rate of 133 bpm without prior EKG available for review.  Chronicity of A-fib was unclear.  He was started on apixaban  and Toprol -XL.  Zio patch in 12/2023 showed sinus rhythm with episodes of PAF with an overall A-fib burden of 65% with an average rate of 120 bpm with the longest episode lasting 8 days and 12 hours.  A-fib was present in activation and deactivation of device.  1 run of NSVT lasting 4 beats, 1 run of NSVT lasting 58 seconds with an average rate of 158 bpm, and rare atrial and ventricular ectopy.   He was seen in the [facility] ED on 02/02/2024 with exertional dyspnea and orthopnea with associated  pleuritic chest discomfort and fatigue following A-fib diagnosis approximately 3 weeks prior.  Workup at that time included a proBNP of 1814, high-sensitivity troponin negative x 3, chest x-ray with mild pulmonary interstitial prominence noted throughout without focal consolidation, pleural effusion, or pneumothorax.  Thoracic aorta was tortuous and with calcification.  EKG reported as sinus rhythm with PACs.  He was treated with IV Lasix  in the ER and prescribed Lasix  20 mg daily as needed at time of discharge.  He followed up with his PCP on 02/08/2024 noting some improvement in fatigue, dyspnea, and edema.  He was taking furosemide  20 mg daily at that time (prescribed a 7-day course by the ED).    He was last seen in the office on 02/16/2024 and was in A-fib with RVR with rates in the 1-teens bpm with recommendation to titrate Toprol -XL to 50 mg.  He was referred to EP.  Dyspnea was much improved, though not at baseline.  After follow-up labs he was started on furosemide  20 mg every other day.  Echo on 03/10/2024 showed an EF of 55 to 60%, LVH, significant beat-to-beat variability with A-fib, low normal RV systolic function, normal ventricular cavity size, mildly elevated RVSP estimated at 40.7 mmHg, mild mitral and tricuspid regurgitation, mild to moderate aortic insufficiency with paradoxical low-flow low gradient with at least moderate aortic stenosis.  Patient presents today with ongoing intermittent symptoms of palpitations with associated shortness of breath and fatigue.  He does not feel that his symptoms have improved with  increased dosing of Toprol -XL.  He does feel that the furosemide  has been doing well to keep the extra fluid off and overall feels near euvolemia.  Continues to experience dyspnea with exertion which is stable.  He reports compliance with his Eliquis  twice daily without symptoms of bleeding or hematochezia.  He denies any chest pain, lightheadedness, dizziness, and orthopnea.  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   Cholecalciferol (D3-1000) 25 MCG (1000 UT) tablet Take 1,000 Units by mouth daily.   Continuous Glucose Sensor (DEXCOM G7 SENSOR) MISC USE TO CHECK BLOOD SUGAR.  CHANGE SENSOR EVERY 10     DAYS.   D-MANNOSE PO Take by mouth.   esomeprazole  (NEXIUM ) 40 MG capsule Take 1 capsule (40 mg total) by mouth daily.   ezetimibe  (ZETIA ) 10 MG tablet Take 1 tablet (10 mg total) by mouth daily.   furosemide  (LASIX ) 20 MG tablet Take 1 tablet (20 mg total) by mouth every other day.   Magnesium  400 MG TABS Take by mouth.   metoprolol  succinate (TOPROL -XL) 50 MG 24 hr tablet Take 1 tablet (50 mg total) by mouth in the morning and at bedtime. Take with or immediately following a meal.   TRIJARDY  XR 25-11-998 MG TB24 TAKE 1 TABLET DAILY   [DISCONTINUED] atorvastatin  (LIPITOR) 10 MG tablet TAKE 1 TABLET BY MOUTH EVERYDAY AT BEDTIME    Allergies:   Patient has no known allergies.   Social History   Socioeconomic History   Marital status: Married    Spouse name: Not on file   Number of  children: Not on file   Years of education: Not on file   Highest education level: Not on file  Occupational History   Not on file  Tobacco Use   Smoking status: Former    Current packs/day: 0.00    Average packs/day: 1 pack/day for 10.0 years (10.0 ttl pk-yrs)    Types: Cigarettes    Start date: 07/06/1964    Quit date: 07/06/1974    Years since quitting: 49.7   Smokeless tobacco: Never  Vaping Use   Vaping status: Never Used  Substance and Sexual Activity   Alcohol use: Yes    Alcohol/week: 1.0 standard drink of alcohol    Types: 1 Standard drinks or equivalent per week   Drug use: Never   Sexual activity: Not on file  Other Topics Concern   Not on file  Social History Narrative   Not on file   Social Drivers of  Health   Financial Resource Strain: Not on file  Food Insecurity: Not on file  Transportation Needs: Not on file  Physical Activity: Not on file  Stress: Not on file  Social Connections: Not on file     Family History:  The patient's family history includes Heart disease in his father and mother; Lung cancer (age of onset: 43) in his brother.  ROS:   12-point review of systems is negative unless otherwise noted in the HPI.   EKGs/Labs/Other Studies Reviewed:    Studies reviewed were summarized above. The additional studies were reviewed today:  2D echo 03/10/2024: 1. Left ventricular ejection fraction, by estimation, is 55 to 60%. The  left ventricle has normal function. There is moderate left ventricular  hypertrophy. Left ventricular diastolic parameters are indeterminate.  Significant beat-to-beat variability in  atrial fibrillation.   2. Right ventricular systolic function is low normal. The right  ventricular size is normal. There is mildly elevated pulmonary artery  systolic pressure.   3. Mild mitral valve regurgitation.   4. Mild tricuspid regurgitation.   5. Aortic valve regurgitation is mild to moderate. There is paradoxical,  low flow-low  gradient aortic stenosis which is at least moderate in  severity. AVA 0.8-0.9, Vmax 2.8 cm/s, MG 16 mmHg with DVI of 0.32.   Comparison(s): When compared to prior study dated 01/13/2022, there is now  evidence of moderate aortic stenosis.   Conclusion(s)/Recommendation(s): Due to findings of moderate aortic  stenosis, a follow up echocardiogram is recommended in 1 year or sooner if  clinically indicated.  __________  Zio patch 12/2023: Rhythm is normal sinus rhythm with paroxysmal atrial fibrillation Patient had a min HR of 61 bpm, max HR of 192 bpm, and avg HR of 108 bpm.  Atrial Fibrillation occurred (65% burden), ranging from 65-192 bpm (avg of 120 bpm), the longest lasting 8 days 12 hours with an avg rate of 120 bpm. Atrial Fibrillation  was present at activation and de-activation of device.   1 run of Ventricular Tachycardia occurred lasting 4 beats with a max rate of 158 bpm (avg 150 bpm). 1 run of Supraventricular Tachycardia occurred lasting 58.0 secs with a max rate of 182 bpm (avg 158 bpm).    Isolated SVEs were rare (<1.0%), SVE Couplets were rare (<1.0%), and SVE Triplets were rare (<1.0%).    Isolated VEs were rare (<1.0%), VE Couplets were rare (<1.0%), and no VE Triplets were present. Ventricular Bigeminy was present.   No patient triggered events recorded __________   Calcium  score 11/01/2023: Ascending Aorta: Normal size   Pericardium: Normal   Coronary arteries: Normal origin of left and right coronary arteries. Distribution of arterial calcifications if present, as noted below;   LM 0   LAD 280   LCx 300   RCA 0   Total 581   IMPRESSION AND RECOMMENDATION: 1. Coronary calcium  score of 581. This was 62nd percentile for age and sex matched control. 2. CAC >300 in LAD, LCx. CAC-DRS A3/N2. 3. Recommend aspirin and statin if no contraindication. 4. Recommend cardiology consultation. 5. Continue heart healthy lifestyle and risk factor modification.    Noncardiac overread: IMPRESSION: Large hiatal hernia.   Small pleural effusions.   Aortic Atherosclerosis __________   2D echo 01/13/2022: 1. Left ventricular ejection fraction, by estimation, is 60 to 65%. The  left ventricle has normal function. The left ventricle has no regional  wall motion abnormalities. There is moderate concentric left ventricular  hypertrophy. Left ventricular  diastolic parameters  are consistent with Grade I diastolic dysfunction  (impaired relaxation). The average left ventricular global longitudinal  strain is -13.1 %.   2. Right ventricular systolic function is normal. The right ventricular  size is normal. Tricuspid regurgitation signal is inadequate for assessing  PA pressure.   3. The mitral valve is normal in structure. No evidence of mitral valve  regurgitation. No evidence of mitral stenosis.   4. The aortic valve is normal in structure. Aortic valve regurgitation is  mild. Aortic valve sclerosis is present, with no evidence of aortic valve  stenosis. Aortic valve area, by VTI measures 2.10 cm.   5. The inferior vena cava is normal in size with greater than 50%  respiratory variability, suggesting right atrial pressure of 3 mmHg.   EKG:  EKG is ordered today.  The EKG ordered today demonstrates atrial fibrillation with rate 91 bpm  Recent Labs: 04/20/2023: ALT 26 02/16/2024: Hemoglobin 15.3; Platelets 428; TSH 2.000 03/14/2024: BUN 28; Creatinine, Ser 1.46; Potassium 5.6; Sodium 138  Recent Lipid Panel    Component Value Date/Time   CHOL 152 04/20/2023 1021   TRIG 95 04/20/2023 1021   HDL 61 04/20/2023 1021   CHOLHDL 2.5 04/20/2023 1021   LDLCALC 73 04/20/2023 1021    PHYSICAL EXAM:    VS:  BP 120/70 (BP Location: Left Arm, Patient Position: Sitting, Cuff Size: Normal)   Pulse 91   Ht 5' 10 (1.778 m)   Wt 183 lb 3.2 oz (83.1 kg)   SpO2 100%   BMI 26.29 kg/m   BMI: Body mass index is 26.29 kg/m.  Physical Exam Vitals and  nursing note reviewed.  Constitutional:      Appearance: Normal appearance.  Cardiovascular:     Rate and Rhythm: Normal rate. Rhythm irregular.     Heart sounds: Murmur (II/VI systolic) heard.     No gallop.  Pulmonary:     Effort: Pulmonary effort is normal.     Breath sounds: Normal breath sounds. No wheezing or rales.  Musculoskeletal:     Right lower leg: No edema.     Left lower leg: No edema.  Skin:    General: Skin is warm and dry.  Neurological:     General: No focal deficit present.     Mental Status: He is alert and oriented to person, place, and time. Mental status is at baseline.  Psychiatric:        Mood and Affect: Mood normal.        Behavior: Behavior normal.     Wt Readings from Last 3 Encounters:  03/16/24 183 lb 3.2 oz (83.1 kg)  02/16/24 184 lb 3.2 oz (83.6 kg)  02/08/24 180 lb 4 oz (81.8 kg)     ASSESSMENT & PLAN:   Persistent A-fib  - Remains in atrial fibrillation with rate of 91 bpm.  Overall he feels as though burden is unchanged.  He continues to have intermittent episodes of palpitations with associated dyspnea and fatigue.  He is continued on Eliquis  5 mg twice daily for CHADS2VASc at least 7 (HTN, age x 2, DM, CVA x 2, vascular disease).  He has been compliant without any missed doses.  Continue metoprolol  succinate 50 mg twice daily.  Recommend proceeding with DCCV. Check CBC. Recent BMP stable aside from elevated K which I will recheck today. He has upcoming appointment with EP to discuss antiarrhythmic therapy and/or ablation.  HFpEF - Ongoing symptoms of dyspnea on exertion which are stable from prior.  Likely in the setting of atrial fibrillation.  Recent echo 03/2024 revealed EF 55 to 60% with moderate LVH and significant beat-to-beat variability, low normal RV SF, mildly elevated PA SF, mild MR, mild to moderate AI, and mild to moderate AS.  Appears euvolemic on exam.  He has continued on Lasix  20 mg every other day.  Recommend repeating limited  echo once sinus rhythm is restored.   Coronary artery disease - No symptoms concerning for angina.  He is continued on apixaban  in place of aspirin.  He has resumed statin therapy as below and is continued on ezetimibe .  Aortic stenosis - Echo 03/2024 revealed mild to moderate aortic stenosis with recommendation for follow-up echocardiogram in 1 year.  Aortic atherosclerosis Hyperlipidemia - Most recent lipid panel with LDL 73 in 04/2023 Target LDL less than 70.  Recent statin holiday without improvement in lower extremity pain.  He will resume atorvastatin  10 mg daily and continue ezetimibe  10 mg daily.  CKD stage IIIa - Renal function stable with recent diuresis  HTN - Blood pressure well-controlled.  He is continued on metoprolol  succinate as above.  History of CVA - He is continued on Eliquis  in place of aspirin.  Continued on atorvastatin  and ezetimibe  as above.   Informed Consent   Shared Decision Making/Informed Consent The risks (stroke, cardiac arrhythmias rarely resulting in the need for a temporary or permanent pacemaker, skin irritation or burns and complications associated with conscious sedation including aspiration, arrhythmia, respiratory failure and death), benefits (restoration of normal sinus rhythm) and alternatives of a direct current cardioversion were explained in detail to Mr. Vore and he agrees to proceed.        Disposition: DCCV. Check CBC and potassium. F/u with Dr. Gollan or an APP in 2-3 weeks.   Medication Adjustments/Labs and Tests Ordered: Current medicines are reviewed at length with the patient today.  Concerns regarding medicines are outlined above. Medication changes, Labs and Tests ordered today are summarized above and listed in the Patient Instructions accessible in Encounters.   Bonney Lesley Maffucci, PA-C 03/16/2024 12:55 PM     [facility] -  [address] [place], [place] [address] [phone]

## 2024-03-15 LAB — BASIC METABOLIC PANEL WITH GFR
BUN/Creatinine Ratio: 19 (ref 10–24)
BUN: 28 mg/dL — ABNORMAL HIGH (ref 8–27)
CO2: 22 mmol/L (ref 20–29)
Calcium: 9.9 mg/dL (ref 8.6–10.2)
Chloride: 95 mmol/L — ABNORMAL LOW (ref 96–106)
Creatinine, Ser: 1.46 mg/dL — ABNORMAL HIGH (ref 0.76–1.27)
Glucose: 190 mg/dL — ABNORMAL HIGH (ref 70–99)
Potassium: 5.6 mmol/L — ABNORMAL HIGH (ref 3.5–5.2)
Sodium: 138 mmol/L (ref 134–144)
eGFR: 49 mL/min/1.73 — ABNORMAL LOW (ref >59)

## 2024-03-16 ENCOUNTER — Ambulatory Visit: Payer: Self-pay | Admitting: Physician Assistant

## 2024-03-16 ENCOUNTER — Other Ambulatory Visit
Admission: RE | Admit: 2024-03-16 | Discharge: 2024-03-16 | Disposition: A | Discharge status: Home / Self Care | Source: Ambulatory Visit | Attending: Physician Assistant | Admitting: Physician Assistant

## 2024-03-16 ENCOUNTER — Ambulatory Visit: Attending: Physician Assistant | Admitting: Physician Assistant

## 2024-03-16 ENCOUNTER — Encounter: Payer: Self-pay | Admitting: Physician Assistant

## 2024-03-16 VITALS — BP 120/70 | HR 91 | Ht 70.0 in | Wt 183.2 lb

## 2024-03-16 DIAGNOSIS — I4819 Other persistent atrial fibrillation: Secondary | ICD-10-CM | POA: Insufficient documentation

## 2024-03-16 DIAGNOSIS — N1831 Chronic kidney disease, stage 3a: Secondary | ICD-10-CM | POA: Diagnosis present

## 2024-03-16 DIAGNOSIS — Z8673 Personal history of transient ischemic attack (TIA), and cerebral infarction without residual deficits: Secondary | ICD-10-CM | POA: Diagnosis present

## 2024-03-16 DIAGNOSIS — I2510 Atherosclerotic heart disease of native coronary artery without angina pectoris: Secondary | ICD-10-CM | POA: Insufficient documentation

## 2024-03-16 DIAGNOSIS — Z789 Other specified health status: Secondary | ICD-10-CM | POA: Diagnosis present

## 2024-03-16 DIAGNOSIS — E1169 Type 2 diabetes mellitus with other specified complication: Secondary | ICD-10-CM | POA: Insufficient documentation

## 2024-03-16 DIAGNOSIS — I700 Atherosclerosis of aorta: Secondary | ICD-10-CM | POA: Diagnosis present

## 2024-03-16 DIAGNOSIS — I10 Essential (primary) hypertension: Secondary | ICD-10-CM | POA: Insufficient documentation

## 2024-03-16 DIAGNOSIS — I350 Nonrheumatic aortic (valve) stenosis: Secondary | ICD-10-CM | POA: Diagnosis present

## 2024-03-16 DIAGNOSIS — E785 Hyperlipidemia, unspecified: Secondary | ICD-10-CM | POA: Insufficient documentation

## 2024-03-16 DIAGNOSIS — I5032 Chronic diastolic (congestive) heart failure: Secondary | ICD-10-CM | POA: Insufficient documentation

## 2024-03-16 DIAGNOSIS — Z79899 Other long term (current) drug therapy: Secondary | ICD-10-CM | POA: Insufficient documentation

## 2024-03-16 LAB — CBC
HCT: 50.9 % (ref 39.0–52.0)
Hemoglobin: 16.6 g/dL (ref 13.0–17.0)
MCH: 28.5 pg (ref 26.0–34.0)
MCHC: 32.6 g/dL (ref 30.0–36.0)
MCV: 87.5 fL (ref 80.0–100.0)
Platelets: 475 K/uL — ABNORMAL HIGH (ref 150–400)
RBC: 5.82 MIL/uL — ABNORMAL HIGH (ref 4.22–5.81)
RDW: 15.6 % — ABNORMAL HIGH (ref 11.5–15.5)
WBC: 11 K/uL — ABNORMAL HIGH (ref 4.0–10.5)
nRBC: 0 % (ref 0.0–0.2)

## 2024-03-16 LAB — POTASSIUM: Potassium: 4.4 mmol/L (ref 3.5–5.1)

## 2024-03-16 MED ORDER — ATORVASTATIN CALCIUM 10 MG PO TABS: 10.0000 mg | ORAL_TABLET | Freq: Every day | ORAL | 3 refills | Status: AC

## 2024-03-16 NOTE — Patient Instructions (Signed)
 Medication Instructions:  Your physician recommends the following medication changes.  START TAKING: Atorvastatin  10 mg daily  *If you need a refill on your cardiac medications before your next appointment, please call your pharmacy*  Lab Work: Your provider would like for you to have following labs drawn (in the medical mall) today CBC and potassium level.   If you have labs (blood work) drawn today and your tests are completely normal, you will receive your results only by: MyChart Message (if you have MyChart) OR A paper copy in the mail If you have any lab test that is abnormal or we need to change your treatment, we will call you to review the results.  Testing/Procedures:    Dear Jorge Mayer  You are scheduled for a Cardioversion on Wednesday, September 17 with Dr. ONEIDA Lunger.  Please arrive at the [facility] Entrance of [facility], [address], [place] [address] at 6:30 AM (This is 1 hour(s) prior to your procedure time).  Proceed to the Check-In Desk directly inside the entrance.  Procedure Parking: Use the entrance off of the [address] side of the hospital. Turn right upon entering and follow the driveway to parking that is directly in front of the [facility]ascular Center. There is no valet parking available at this entrance, however there is an awning directly in front of the [facility] Vascular Center for drop off/ pick up for patients.   DIET:  Nothing to eat or drink after midnight except a sip of water with medications (see medication instructions below)  MEDICATION INSTRUCTIONS: !!IF ANY NEW MEDICATIONS ARE STARTED AFTER TODAY, PLEASE NOTIFY YOUR PROVIDER AS SOON AS POSSIBLE!!  FYI: Medications such as Semaglutide (Ozempic, [place]), Tirzepatide (Mounjaro, Zepbound), Dulaglutide (Trulicity), etc (GLP1 agonists) AND Canagliflozin (Invokana), Dapagliflozin (Farxiga), Empagliflozin (Jardiance), Ertugliflozin (Steglatro), Bexagliflozin [employer]) or any  combination with one of these drugs such as Invokamet (Canagliflozin/Metformin), Synjardy (Empagliflozin/Metformin), etc (SGLT2 inhibitors) must be held around the time of a procedure. This is not a comprehensive list of all of these drugs. Please review all of your medications and talk to your provider if you take any one of these. If you are not sure, ask your provider.   HOLD: Empagliflozin/Linagliptin/Metformin (Trijardy  XR) for 3 days prior to the procedure. Last dose on Saturday, September 13.   Continue taking your anticoagulant (blood thinner): Apixaban  (Eliquis ).  You will need to continue this after your procedure until you are told by your provider that it is safe to stop.    LABS: today in the medical mall  FYI:  For your safety, and to allow us  to monitor your vital signs accurately during the surgery/procedure we request: If you have artificial nails, gel coating, SNS etc, please have those removed prior to your surgery/procedure. Not having the nail coverings /polish removed may result in cancellation or delay of your surgery/procedure.  You must have a responsible person to drive you home and stay in the waiting area during your procedure. Failure to do so could result in cancellation.  Mayer your insurance cards.  *Special Note: Every effort is made to have your procedure done on time. Occasionally there are emergencies that occur at the hospital that may cause delays. Please be patient if a delay does occur.    Follow-Up: At [facility], you and your health needs are our priority.  As part of our continuing mission to provide you with exceptional heart care, our providers are all part of one  team.  This team includes your primary Cardiologist (physician) and Advanced Practice Providers or APPs (Physician Assistants and Nurse Practitioners) who all work together to provide you with the care you need, when you need it.  Your next appointment:   2  week(s)  Provider:   You will see one of the following Advanced Practice Providers on your designated Care Team:   Jorge Maffucci, Jorge Bernardino Bring, Jorge

## 2024-03-21 ENCOUNTER — Other Ambulatory Visit: Payer: Self-pay | Admitting: Cardiovascular Disease

## 2024-03-21 DIAGNOSIS — I4819 Other persistent atrial fibrillation: Secondary | ICD-10-CM

## 2024-03-22 ENCOUNTER — Ambulatory Visit
Admission: RE | Admit: 2024-03-22 | Discharge: 2024-03-22 | Disposition: A | Discharge status: Home / Self Care | Attending: Cardiovascular Disease | Admitting: Cardiovascular Disease

## 2024-03-22 ENCOUNTER — Ambulatory Visit: Admitting: General Practice

## 2024-03-22 ENCOUNTER — Encounter
Admission: RE | Disposition: A | Discharge status: Home / Self Care | Payer: Self-pay | Source: Home / Self Care | Attending: Cardiovascular Disease

## 2024-03-22 ENCOUNTER — Encounter: Payer: Self-pay | Admitting: Cardiovascular Disease

## 2024-03-22 ENCOUNTER — Other Ambulatory Visit: Payer: Self-pay

## 2024-03-22 ENCOUNTER — Encounter: Admission: RE | Payer: Self-pay | Source: Home / Self Care

## 2024-03-22 DIAGNOSIS — Z8249 Family history of ischemic heart disease and other diseases of the circulatory system: Secondary | ICD-10-CM | POA: Insufficient documentation

## 2024-03-22 DIAGNOSIS — I130 Hypertensive heart and chronic kidney disease with heart failure and stage 1 through stage 4 chronic kidney disease, or unspecified chronic kidney disease: Secondary | ICD-10-CM | POA: Insufficient documentation

## 2024-03-22 DIAGNOSIS — I2510 Atherosclerotic heart disease of native coronary artery without angina pectoris: Secondary | ICD-10-CM | POA: Insufficient documentation

## 2024-03-22 DIAGNOSIS — Z7901 Long term (current) use of anticoagulants: Secondary | ICD-10-CM | POA: Diagnosis not present

## 2024-03-22 DIAGNOSIS — I350 Nonrheumatic aortic (valve) stenosis: Secondary | ICD-10-CM | POA: Insufficient documentation

## 2024-03-22 DIAGNOSIS — N1831 Chronic kidney disease, stage 3a: Secondary | ICD-10-CM | POA: Insufficient documentation

## 2024-03-22 DIAGNOSIS — I5033 Acute on chronic diastolic (congestive) heart failure: Secondary | ICD-10-CM | POA: Insufficient documentation

## 2024-03-22 DIAGNOSIS — Z79899 Other long term (current) drug therapy: Secondary | ICD-10-CM | POA: Insufficient documentation

## 2024-03-22 DIAGNOSIS — K219 Gastro-esophageal reflux disease without esophagitis: Secondary | ICD-10-CM | POA: Diagnosis not present

## 2024-03-22 DIAGNOSIS — Z8673 Personal history of transient ischemic attack (TIA), and cerebral infarction without residual deficits: Secondary | ICD-10-CM | POA: Insufficient documentation

## 2024-03-22 DIAGNOSIS — E1122 Type 2 diabetes mellitus with diabetic chronic kidney disease: Secondary | ICD-10-CM | POA: Insufficient documentation

## 2024-03-22 DIAGNOSIS — R0602 Shortness of breath: Secondary | ICD-10-CM

## 2024-03-22 DIAGNOSIS — K449 Diaphragmatic hernia without obstruction or gangrene: Secondary | ICD-10-CM | POA: Diagnosis not present

## 2024-03-22 DIAGNOSIS — Z87891 Personal history of nicotine dependence: Secondary | ICD-10-CM | POA: Diagnosis not present

## 2024-03-22 DIAGNOSIS — I700 Atherosclerosis of aorta: Secondary | ICD-10-CM | POA: Insufficient documentation

## 2024-03-22 DIAGNOSIS — I4819 Other persistent atrial fibrillation: Secondary | ICD-10-CM | POA: Diagnosis not present

## 2024-03-22 DIAGNOSIS — E785 Hyperlipidemia, unspecified: Secondary | ICD-10-CM | POA: Insufficient documentation

## 2024-03-22 HISTORY — PX: CARDIOVERSION: SHX1299

## 2024-03-22 LAB — GLUCOSE, CAPILLARY: Glucose-Capillary: 176 mg/dL — ABNORMAL HIGH (ref 70–99)

## 2024-03-22 SURGERY — CARDIOVERSION
Anesthesia: General

## 2024-03-22 MED ORDER — PROPOFOL 10 MG/ML IV BOLUS
INTRAVENOUS | Status: AC
  Filled 2024-03-22: qty 20

## 2024-03-22 MED ORDER — PROPOFOL 500 MG/50ML IV EMUL
INTRAVENOUS | Status: DC | PRN
  Administered 2024-03-22: 80 mg via INTRAVENOUS

## 2024-03-22 MED ORDER — SODIUM CHLORIDE 0.9 % IV SOLN: INTRAVENOUS | Status: DC

## 2024-03-22 MED ORDER — PROPOFOL 10 MG/ML IV BOLUS
INTRAVENOUS | Status: AC
Start: 2024-03-22 — End: 2024-03-22
  Filled 2024-03-22: qty 20

## 2024-03-22 NOTE — CV Procedure (Signed)
Cardioversion procedure note For atrial fibrillation, persistent.  Procedure Details:  Consent: Risks of procedure as well as the alternatives and risks of each were explained to the (patient/caregiver).  Consent for procedure obtained.  Time Out: Verified patient identification, verified procedure, site/side was marked, verified correct patient position, special equipment/implants available, medications/allergies/relevent history reviewed, required imaging and test results available.  Performed  Patient placed on cardiac monitor, pulse oximetry, supplemental oxygen as necessary.   Sedation given: propofol IV, Dr. Lorette Ang Pacer pads placed anterior and posterior chest.   Cardioverted 1 time(s).   Cardioverted at  150 J. Synchronized biphasic Converted to NSR   Evaluation: Findings: Post procedure EKG shows: NSR Complications: None Patient did tolerate procedure well.  Time Spent Directly with the Patient:  45 minutes   Dossie Arbour, M.D., Ph.D.

## 2024-03-22 NOTE — Transfer of Care (Signed)
 Immediate Anesthesia Transfer of Care Note  Patient: Jorge Mayer  Procedure(s) Performed: CARDIOVERSION  Patient Location: Cath Lab, SP room 14  Anesthesia Type:General  Level of Consciousness: drowsy  Airway & Oxygen Therapy: Patient Spontanous Breathing and Patient connected to nasal cannula oxygen  Post-op Assessment: Report given to RN and Post -op Vital signs reviewed and stable  Post vital signs: Reviewed and stable  Last Vitals:  Vitals Value Taken Time  BP 101/76 0743  Temp 35.9 0743  Pulse 83 03/22/24 07:29  Resp 23 03/22/24 07:29  SpO2 98 % 03/22/24 07:29  Vitals shown include unfiled device data.  Last Pain:  Vitals:   03/22/24 0709  TempSrc: Temporal  PainSc: 0-No pain    VSS. Pt. Being recovered in room where procedure was completed. Reported to RN taking care of patient.      Complications: No notable events documented.

## 2024-03-22 NOTE — Anesthesia Preprocedure Evaluation (Signed)
 Anesthesia Evaluation  Patient identified by MRN, date of birth, ID band Patient awake    Reviewed: Allergy & Precautions, NPO status , Patient's Chart, lab work & pertinent test results  Airway Mallampati: III  TM Distance: >3 FB Neck ROM: full    Dental  (+) Chipped   Pulmonary neg pulmonary ROS, former smoker   Pulmonary exam normal        Cardiovascular hypertension, + CAD  + dysrhythmias Atrial Fibrillation + Valvular Problems/Murmurs AS      Neuro/Psych CVA, No Residual Symptoms  negative psych ROS   GI/Hepatic Neg liver ROS,GERD  ,,  Endo/Other  negative endocrine ROS    Renal/GU Renal disease  negative genitourinary   Musculoskeletal   Abdominal   Peds  Hematology negative hematology ROS (+)   Anesthesia Other Findings Past Medical History: No date: Actinic keratosis No date: Chronic kidney disease 03/12/2021: Dysplastic nevus     Comment:  left upper quad abdomen, mod to severe, excised 04/22/21 No date: GERD (gastroesophageal reflux disease) No date: Hypertension No date: Prostate disease No date: Squamous cell carcinoma of skin     Comment:  scalp and forehead, txted in [place]  No date: Stroke [facility]) No date: Urinary incontinence  Past Surgical History: 2024: CATARACT EXTRACTION; Bilateral No date: SHOULDER SURGERY     Comment:  Right side  BMI    Body Mass Index: 26.54 kg/m      Reproductive/Obstetrics negative OB ROS                              Anesthesia Physical Anesthesia Plan  ASA: 3  Anesthesia Plan: General   Post-op Pain Management:    Induction: Intravenous  PONV Risk Score and Plan: 2 and Propofol  infusion and TIVA  Airway Management Planned: Natural Airway and Nasal Cannula  Additional Equipment:   Intra-op Plan:   Post-operative Plan:   Informed Consent: I have reviewed the patients History and Physical, chart, labs and discussed the  procedure including the risks, benefits and alternatives for the proposed anesthesia with the patient or authorized representative who has indicated his/her understanding and acceptance.     Dental Advisory Given  Plan Discussed with: Anesthesiologist, CRNA and Surgeon  Anesthesia Plan Comments: (Patient consented for risks of anesthesia including but not limited to:  - adverse reactions to medications - risk of airway placement if required - damage to eyes, teeth, lips or other oral mucosa - nerve damage due to positioning  - sore throat or hoarseness - Damage to heart, brain, nerves, lungs, other parts of body or loss of life  Patient voiced understanding and assent.)        Anesthesia Quick Evaluation

## 2024-03-23 ENCOUNTER — Encounter: Payer: Self-pay | Admitting: Cardiovascular Disease

## 2024-03-23 NOTE — Anesthesia Postprocedure Evaluation (Signed)
 Anesthesia Post Note  Patient: Jorge Mayer  Procedure(s) Performed: CARDIOVERSION  Patient location during evaluation: Specials Recovery Anesthesia Type: General Level of consciousness: awake and alert Pain management: pain level controlled Vital Signs Assessment: post-procedure vital signs reviewed and stable Respiratory status: spontaneous breathing, nonlabored ventilation, respiratory function stable and patient connected to nasal cannula oxygen Cardiovascular status: blood pressure returned to baseline and stable Postop Assessment: no apparent nausea or vomiting Anesthetic complications: no   No notable events documented.   Last Vitals:  Vitals:   03/22/24 0817 03/22/24 0830  BP: 92/66 101/71  Pulse: (!) 59 63  Resp: 14 14  Temp:  (!) 36.3 C  SpO2: 96% 95%    Last Pain:  Vitals:   03/22/24 0830  TempSrc: Temporal  PainSc: 0-No pain                 Debby Mines

## 2024-03-24 ENCOUNTER — Ambulatory Visit: Admitting: Nurse Practitioner

## 2024-03-24 ENCOUNTER — Telehealth: Payer: Self-pay | Admitting: Cardiovascular Disease

## 2024-03-24 NOTE — Telephone Encounter (Signed)
 Patient's wife called to report that the patient has been experiencing shortness of breath for the past day. She noted that yesterday, the patient had labored breathing and was short of breath both with exertion and at rest. She denies any swelling but reported that the patient's oxygen level has been averaging between 90-91%, and he is extremely fatigued.  Today, she reports the patient seems slightly improved, but he is experiencing chest achiness that began this morning.  The patient recently underwent a cardioversion on 03/24/24. The wife reported the current heart rate is between 60-70 bpm.  Based on the current symptoms, the nurse recommended the patient report to the emergency room for further evaluation. The wife verbalized understanding.

## 2024-03-24 NOTE — Telephone Encounter (Signed)
 Pt c/o Shortness Of Breath: STAT if SOB developed within the last 24 hours or pt is noticeably SOB on the phone  1. Are you currently SOB (can you hear that pt is SOB on the phone)? Yes, but was more severe yesterday  2. How long have you been experiencing SOB? Since yesterday   3. Are you SOB when sitting or when up moving around? Both, more severe when moving   4. Are you currently experiencing any other symptoms? Fatigue

## 2024-03-25 NOTE — H&P (Signed)

## 2024-03-25 NOTE — Interval H&P Note (Signed)
 History and Physical Interval Note:  03/25/2024 2:00 PM  Jorge Mayer  has presented today for surgery, with the diagnosis of Cardioversion   Afib.  The various methods of treatment have been discussed with the patient and family. After consideration of risks, benefits and other options for treatment, the patient has consented to  Procedure(s): CARDIOVERSION (N/A) as a surgical intervention.  The patient's history has been reviewed, patient examined, no change in status, stable for surgery.  I have reviewed the patient's chart and labs.  Questions were answered to the patient's satisfaction.     Talib Headley

## 2024-03-29 NOTE — Progress Notes (Unsigned)
 Cardiology Office Note:    Date:  03/30/2024   ID:  Jorge Mayer, DOB [birth year], MRN [id number]  PCP:  Edman Marsa PARAS, DO   [facility] Providers Cardiologist:  Evalene Lunger, MD Electrophysiologist:  OLE ONEIDA HOLTS, MD { Click to update primary MD,subspecialty MD or APP then REFRESH:1}    Referring MD: Edman Marsa *   Chief complaint: Cardioversion follow-up  History of Present Illness:    Jorge Mayer is a 78 y.o. 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  Echo 01/13/2022: LVEF 60-65%, normal LV function, no RWMA, moderate LVH, G1 DD.  Normal RV.  Normal mitral valve.  Mild aortic valve regurgitation, AV valve sclerosis present, no stenosis, RA pressure 3 mmHg.  Patient moved to the area from [place]  4 years ago.  Was referred to our practice in June of this year by his PCP for an elevated calcium  score.  CT calcium  scoring on 11/01/2023 was 581, predominantly LAD and LCfx, showed large hiatal hernia and small pleural effusions. While in the office EKG showed A-fib with RVR at a rate of 133 bpm, patient was asymptomatic, unknown start time of A-fib.  Was started on Eliquis , metoprolol , ZIO monitoring ordered.  ZIO monitor from 01/06/2024 - 01/20/2024 showed NSR with PAF, min HR 61 bpm, max HR 192 bpm, average HR 108 bpm.  Atrial fibrillation 65% burden, ranging from 65-192 bpm, longest lasting 8 days 12 hours with an average rate of 120 bpm, A-fib present at activation and deactivation of device.  1 run of V. tach occurring 4 beats. 1 run of SVT lasting 58 seconds.  Presented to ED on 02/02/2024 with complaints of SOB, central pleuritic chest pain, fatigue.  Pitting edema bilateral lower extremities present. BNP 1814, given diuretic, symptoms improved.  Echo 03/2024 revealed EF 55 to 60% with moderate LVH  and significant beat-to-beat variability, low normal RV SF, mildly elevated PA SF, mild MR, mild to moderate AI, and mild to moderate AS.  DCCV performed on 03/22/2024, cardioverted at 150 J X1, converted to NSR.  03/24/2024: Called the office reporting increased SOB since 9/18 with chest soreness and O2 levels 90-91% on RA with extreme fatigue.  Telephone nurse advised to proceed to the ED, wife verbalized understanding.  No current documentation exists in the EMR of an ED visit.  Patient presents to the office with his wife, appears stable from a cardiac standpoint. Denies chest pain, dizziness, lightheadedness, near syncope, nausea, vomiting, dark/tarry/bloody stools, weight gain/loss, leg swelling. Does endorse SOB occurring directly after the DCCV that improved the following morning. Mild DOE and orthopnea occasionally, can feel when his heart rate speeds up. Able to speak in full sentences, O2 sat 96% on RA, joked about how his wife accidentally dropped him off at the wrong entrance today and that his heart rate may be a product of his recent walk.   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   left upper quad abdomen, mod to severe, excised 04/22/21   GERD (gastroesophageal reflux disease)    Heart murmur 2024   Hypertension    Prostate disease    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, txted in [place]    Stroke (  HCC)    Urinary incontinence     Past Surgical History:  Procedure Laterality Date   CARDIOVERSION N/A 03/22/2024   Procedure: CARDIOVERSION;  Surgeon: Perla Evalene PARAS, MD;  Location: [facility] ORS;  Service: Cardiovascular;  Laterality: N/A;   CATARACT EXTRACTION Bilateral 2024   SHOULDER SURGERY     Right side    Current Medications: Current Meds  Medication Sig   apixaban  (ELIQUIS ) 5 MG TABS tablet Take 1 tablet (5 mg total) by  mouth 2 (two) times daily.   atorvastatin  (LIPITOR) 10 MG tablet Take 1 tablet (10 mg total) by mouth daily.   Cholecalciferol (D3-1000) 25 MCG (1000 UT) tablet Take 1,000 Units by mouth every evening.   Continuous Glucose Sensor (DEXCOM G7 SENSOR) MISC USE TO CHECK BLOOD SUGAR.  CHANGE SENSOR EVERY 10     DAYS.   D-MANNOSE PO Take 1 tablet by mouth in the morning.   esomeprazole  (NEXIUM ) 40 MG capsule Take 1 capsule (40 mg total) by mouth daily.   ezetimibe  (ZETIA ) 10 MG tablet Take 1 tablet (10 mg total) by mouth daily.   Magnesium  400 MG TABS Take 400 mg by mouth in the morning.   TRIJARDY  XR 25-11-998 MG TB24 TAKE 1 TABLET DAILY   [DISCONTINUED] furosemide  (LASIX ) 20 MG tablet Take 1 tablet (20 mg total) by mouth every other day.   [DISCONTINUED] metoprolol  succinate (TOPROL -XL) 50 MG 24 hr tablet Take 1 tablet (50 mg total) by mouth in the morning and at bedtime. Take with or immediately following a meal.     Allergies:   Patient has no known allergies.   Social History   Socioeconomic History   Marital status: Married    Spouse name: Not on file   Number of children: Not on file   Years of education: Not on file   Highest education level: Not on file  Occupational History   Not on file  Tobacco Use   Smoking status: Former    Current packs/day: 0.00    Average packs/day: 1 pack/day for 10.0 years (10.0 ttl pk-yrs)    Types: Cigarettes    Start date: 07/06/1964    Quit date: 07/06/1974    Years since quitting: 49.7   Smokeless tobacco: Never  Vaping Use   Vaping status: Never Used  Substance and Sexual Activity   Alcohol use: Yes    Alcohol/week: 1.0 standard drink of alcohol    Types: 1 Standard drinks or equivalent per week   Drug use: Never   Sexual activity: Not on file  Other Topics Concern   Not on file  Social History Narrative   Not on file   Social Drivers of Health   Financial Resource Strain: Not on file  Food Insecurity: Not on file  Transportation  Needs: Not on file  Physical Activity: Not on file  Stress: Not on file  Social Connections: Not on file     Family History: The patient's family history includes Heart disease in his father and mother; Lung cancer (age of onset: 75) in his brother.  ROS:   Please see the history of present illness.    All other systems reviewed and are negative.  EKGs/Labs/Other Studies Reviewed:    The following studies were reviewed today:  EKG Interpretation Date/Time:  Thursday March 30 2024 15:29:00 EDT Ventricular Rate:  114 PR Interval:    QRS Duration:  66 QT Interval:  326 QTC Calculation: 449 R Axis:   -9  Text Interpretation: Coarse  Atrial fibrillation Low voltage QRS Cannot rule out Inferior infarct , age undetermined When compared with ECG of 22-Mar-2024 07:37, PREVIOUS ECG IS PRESENT Confirmed by Franca Stakes [phone]) on 03/30/2024 3:37:56 PM    Recent Labs: 04/20/2023: ALT 26 02/16/2024: TSH 2.000 03/14/2024: BUN 28; Creatinine, Ser 1.46; Sodium 138 03/16/2024: Hemoglobin 16.6; Platelets 475; Potassium 4.4  Recent Lipid Panel    Component Value Date/Time   CHOL 152 04/20/2023 1021   TRIG 95 04/20/2023 1021   HDL 61 04/20/2023 1021   CHOLHDL 2.5 04/20/2023 1021   LDLCALC 73 04/20/2023 1021     Risk Assessment/Calculations:    CHA2DS2-VASc Score =    {Click here to calculate score.  REFRESH note before signing. :1} This indicates a  % annual risk of stroke. The patient's score is based upon: CHF History: 1 HTN History: 1 Diabetes History: 1 Stroke History: 2 Age Score: 2 Gender Score: 0     Physical Exam:    VS:  BP 128/70 (BP Location: Left Arm, Patient Position: Sitting, Cuff Size: Normal)   Pulse (!) 114   Wt 182 lb (82.6 kg)   SpO2 96%   BMI 26.11 kg/m     Wt Readings from Last 3 Encounters:  03/30/24 182 lb (82.6 kg)  03/22/24 185 lb (83.9 kg)  03/16/24 183 lb 3.2 oz (83.1 kg)     GEN:  Well nourished, well developed in no acute  distress HEENT: Normal NECK: Right sided carotid bruit CARDIAC: irregular rate and rhythm, systolic murmur 2/6, no rubs, gallops RESPIRATORY:  Clear to auscultation without rales, wheezing or rhonchi  ABDOMEN: Soft, non-tender, non-distended MUSCULOSKELETAL:  No edema; No deformity, DP/PT 2+ bilaterally SKIN: Warm and dry NEUROLOGIC:  Alert and oriented x 3 PSYCHIATRIC:  Normal affect   ASSESSMENT:    1. PAF (paroxysmal atrial fibrillation) (HCC)   2. History of cardioversion   3. Pleural effusion   4. Coronary artery disease involving native coronary artery of native heart without angina pectoris   5. Aortic atherosclerosis   6. Hyperlipidemia, unspecified hyperlipidemia type   7. Bruit of right carotid artery    PLAN:    In order of problems listed above:  Atrial fibrillation s/p DCCV  EKG: Coarse atrial fibrillation, 114 bpm  Reports mild SOB with exertion and occasional orthopnea  CHA2DS2-VASc = 8  Continue Eliquis  5 mg daily, as this is the correct dose for his age, weight, creatinine  Increase metoprolol  XL to 75 mg BID for better rate control   Follow up with Dr. Cindie, your EP specialist, next week as scheduled  Pleural effusion HFpEF  Likely secondary to uncontrolled paroxysmal atrial fibrillation Discussed importance of monitoring daily weights, states he has been monitoring these at home, and was down to 177 lb today, which is lower than his normal.  Appears euvolemic on exam  Continue metoprolol  succinate 50 mg daily Continue Lasix  20 mg daily, may take extra tablet if needed for weight gain of greater than 3 pounds overnight or 5 pounds in one week   CAD  CT calcium  scoring 11/01/2023: 581, predominantly in LAD and LCfx  Currently on eliquis  for a. Fib instead of aspirin  Currently on a statin holiday secondary to myalgias while on rosuvastatin   Right sided carotid bruit  Difficult to differentiate between carotid bruit vs resonant murmur  No carotid  doppler study on file in our system  Reports a hx of CVA  Will order bilateral carotid doppler   HLD/aortic atherosclerosis  Currently on a statin holiday secondary to myalgias while on rosuvastatin   Continue Zetia  10 mg  Would advise lipid panel at follow up visit  Follow up in three months with general cardiology to re-evaluate SOB/orthopnea/Hfpef following EP work up Follow up next week with EP    Medication Adjustments/Labs and Tests Ordered: Current medicines are reviewed at length with the patient today.  Concerns regarding medicines are outlined above.  Orders Placed This Encounter  Procedures   EKG 12-Lead   VAS US  CAROTID   Meds ordered this encounter  Medications   furosemide  (LASIX ) 20 MG tablet    Sig: Take 1 tablet (20 mg total) by mouth every other day. You may take an additional 20 mg tablet once per day IF you have overnight weight gain of more than 3 pounds or more than 5 pounds in a week.    Dispense:  60 tablet    Refill:  3   metoprolol  succinate (TOPROL -XL) 50 MG 24 hr tablet    Sig: Take 1.5 tablets (75 mg total) by mouth in the morning and at bedtime. Take with or immediately following a meal.    Dispense:  270 tablet    Refill:  3    Patient Instructions  Medication Instructions:  Your physician recommends the following medication changes.  INCREASE: Toprol -XL 75 mg twice daily  Lasix  20 mg additional tablet (once daily) IF overnight weight gain of more than 3 pounds or more than 5 pounds in a week   *If you need a refill on your cardiac medications before your next appointment, please call your pharmacy*  Lab Work: None ordered at this time   Testing/Procedures: Your physician has requested that you have a carotid duplex. This test is an ultrasound of the carotid arteries in your neck. It looks at blood flow through these arteries that supply the brain with blood.   Allow one hour for this exam.  There are no restrictions or special  instructions.  This will take place at [address] ([facility] [address], [place] [address]  Follow-Up: At [facility], you and your health needs are our priority.  As part of our continuing mission to provide you with exceptional heart care, our providers are all part of one team.  This team includes your primary Cardiologist (physician) and Advanced Practice Providers or APPs (Physician Assistants and Nurse Practitioners) who all work together to provide you with the care you need, when you need it.  Your next appointment:    With Dr Cindie  General cardiology: 3 month(s)  Provider:   You may see Timothy Gollan, MD or Bernardino Bring, PA-C   Signed, Jasai Sorg E Lujean Ebright, NP  03/30/2024 6:17 PM    [facility]

## 2024-03-30 ENCOUNTER — Encounter: Payer: Self-pay | Admitting: Physician Assistant

## 2024-03-30 ENCOUNTER — Ambulatory Visit: Attending: Physician Assistant | Admitting: Emergency Medicine

## 2024-03-30 VITALS — BP 128/70 | HR 114 | Wt 182.0 lb

## 2024-03-30 DIAGNOSIS — R0989 Other specified symptoms and signs involving the circulatory and respiratory systems: Secondary | ICD-10-CM | POA: Diagnosis present

## 2024-03-30 DIAGNOSIS — Z9289 Personal history of other medical treatment: Secondary | ICD-10-CM | POA: Diagnosis present

## 2024-03-30 DIAGNOSIS — I2510 Atherosclerotic heart disease of native coronary artery without angina pectoris: Secondary | ICD-10-CM | POA: Insufficient documentation

## 2024-03-30 DIAGNOSIS — E785 Hyperlipidemia, unspecified: Secondary | ICD-10-CM | POA: Diagnosis present

## 2024-03-30 DIAGNOSIS — I480 Paroxysmal atrial fibrillation: Secondary | ICD-10-CM | POA: Insufficient documentation

## 2024-03-30 DIAGNOSIS — I700 Atherosclerosis of aorta: Secondary | ICD-10-CM | POA: Diagnosis present

## 2024-03-30 DIAGNOSIS — J90 Pleural effusion, not elsewhere classified: Secondary | ICD-10-CM | POA: Insufficient documentation

## 2024-03-30 MED ORDER — FUROSEMIDE 20 MG PO TABS: 20.0000 mg | ORAL_TABLET | ORAL | 3 refills | Status: DC

## 2024-03-30 MED ORDER — METOPROLOL SUCCINATE ER 50 MG PO TB24: 75.0000 mg | ORAL_TABLET | Freq: Two times a day (BID) | ORAL | 3 refills | Status: DC

## 2024-03-30 NOTE — Patient Instructions (Addendum)
 Medication Instructions:  Your physician recommends the following medication changes.  INCREASE: Toprol -XL 75 mg twice daily  Lasix  20 mg additional tablet (once daily) IF overnight weight gain of more than 3 pounds or more than 5 pounds in a week   *If you need a refill on your cardiac medications before your next appointment, please call your pharmacy*  Lab Work: None ordered at this time   Testing/Procedures: Your physician has requested that you have a carotid duplex. This test is an ultrasound of the carotid arteries in your neck. It looks at blood flow through these arteries that supply the brain with blood.   Allow one hour for this exam.  There are no restrictions or special instructions.  This will take place at [address] ([facility] [address], [place] [address]  Follow-Up: At [facility], you and your health needs are our priority.  As part of our continuing mission to provide you with exceptional heart care, our providers are all part of one team.  This team includes your primary Cardiologist (physician) and Advanced Practice Providers or APPs (Physician Assistants and Nurse Practitioners) who all work together to provide you with the care you need, when you need it.  Your next appointment:    With Dr Cindie  General cardiology: 3 month(s)  Provider:   You may see Timothy Gollan, MD or Bernardino Bring, PA-C

## 2024-03-31 ENCOUNTER — Ambulatory Visit: Payer: Self-pay

## 2024-03-31 ENCOUNTER — Ambulatory Visit (INDEPENDENT_AMBULATORY_CARE_PROVIDER_SITE_OTHER): Admitting: Internal Medicine

## 2024-03-31 ENCOUNTER — Encounter: Payer: Self-pay | Admitting: Internal Medicine

## 2024-03-31 VITALS — BP 112/74 | HR 107 | Ht 70.0 in | Wt 181.0 lb

## 2024-03-31 DIAGNOSIS — L03031 Cellulitis of right toe: Secondary | ICD-10-CM | POA: Diagnosis not present

## 2024-03-31 DIAGNOSIS — R300 Dysuria: Secondary | ICD-10-CM

## 2024-03-31 LAB — POCT URINALYSIS DIPSTICK
Bilirubin, UA: NEGATIVE
Blood, UA: NEGATIVE
Glucose, UA: POSITIVE — AB
Ketones, UA: NEGATIVE
Leukocytes, UA: NEGATIVE
Nitrite, UA: NEGATIVE
Protein, UA: NEGATIVE
Spec Grav, UA: 1.015 (ref 1.010–1.025)
Urobilinogen, UA: 0.2 U/dL
pH, UA: 5 (ref 5.0–8.0)

## 2024-03-31 MED ORDER — AMOXICILLIN-POT CLAVULANATE 875-125 MG PO TABS: 1.0000 | ORAL_TABLET | Freq: Two times a day (BID) | ORAL | 0 refills | Status: AC

## 2024-03-31 NOTE — Telephone Encounter (Signed)
 Appointment made for today 03/31/2024 at 11:20am with Dr Leita Adie at [facility] due to no available appointments within PCP office today.   FYI Only or Action Required?: FYI only for provider.  Patient was last seen in primary care on 02/08/2024 by Edman Marsa PARAS, DO.  Called Nurse Triage reporting Toe Pain.  Symptoms began several weeks ago for urinary symptom and several days ago for right big toe.  Interventions attempted: OTC medications: D -Mannose and Rest, hydration, or home remedies.  Symptoms are: unchanged.  Triage Disposition: See PCP When Office is Open (Within 3 Days), See PCP Within 2 Weeks  Patient/caregiver understands and will follow disposition?: Yes--patient scheduled to see a provider today         Copied from CRM #[id number]. Topic: Clinical - Red Word Triage >> Mar 31, 2024  9:22 AM [name] wrote: Red Word that prompted transfer to Nurse Triage: Right big toe swollen, nail discolored. Painful. Also possible UTI. Reason for Disposition  All other urine symptoms  Diabetes mellitus or peripheral vascular disease (poor circulation)  Answer Assessment - Initial Assessment Questions Patient states he has been taking D Mannose & he thinks this may be keeping a possible UTI from getting too bad Patient is advised that if anything worsens to go to the Emergency Room. Patient verbalized understanding.   1. SYMPTOM: What's the main symptom you're concerned about? (e.g., frequency, incontinence)     Burning with urination 2. ONSET: When did the  burning  start?     Several weeks ago per patient 3. PAIN: Is there any pain? If Yes, ask: How bad is it? (Scale: 1-10; mild, moderate, severe)     Very slight 4. CAUSE: What do you think is causing the symptoms?     unsure 5. OTHER SYMPTOMS: Do you have any other symptoms? (e.g., blood in urine, fever, flank pain, pain with urination)     Big toe swelling--separate  complaint  Answer Assessment - Initial Assessment Questions Patient is advised that if anything worsens to go to the Emergency Room. Patient verbalized understanding.  1. LOCATION: Which toe?      Big toe on right foot---had an issue beside the nail and then today all around the toe nail was red and swollen 2. APPEARANCE: What does it look like?      Red and swollen 3. ONSET: When did it start?      Several days but the redness/swelling around the toenail started today 4. PAIN: Is there any pain? If Yes, ask: How bad is the pain?   (Scale 1-10; or mild, moderate, severe)     Patient states it doesn't really hurt 5. REDNESS: Is there any redness of the skin? If Yes, ask: How much of the toe is red?     Red around toenail on big toe of right foot 6. OTHER SYMPTOMS: Do you have any other symptoms? (e.g., chills, fever, red streak up foot)     Red and swollen  Protocols used: Toenail - Ingrown-A-AH, Urinary Symptoms-A-AH

## 2024-03-31 NOTE — Progress Notes (Signed)
 Date:  03/31/2024   Name:  Jorge Mayer   DOB:  1945-12-08   MRN:  [id number]   Chief Complaint: Toe Pain (X2-3 days. Swollen and red Rt great toe. Patient said it does not hurt.) and Dysuria (X1-2 weeks. Burning while urinating. Hx of UTI's per patient.)  Toe Pain  Incident onset: noticed it this AM. There was no injury mechanism. The pain is present in the right toes. The patient is experiencing no pain. Associated symptoms include numbness (some baseline neuropathy). Nothing aggravates the symptoms. He has tried nothing for the symptoms.  Dysuria  This is a new problem. The current episode started yesterday. The problem occurs intermittently (hx of UTI). The quality of the pain is described as burning. The pain is at a severity of 1/10. The pain is mild. Pertinent negatives include no chills, discharge or nausea. There is no history of catheterization, kidney stones or a urological procedure.    Review of Systems  Constitutional:  Negative for chills, fatigue and fever.  Respiratory:  Negative for chest tightness and shortness of breath.   Cardiovascular:  Positive for palpitations (Afib). Negative for chest pain and leg swelling.  Gastrointestinal:  Negative for nausea.  Genitourinary:  Positive for dysuria.  Musculoskeletal:  Negative for arthralgias.  Skin:  Positive for color change (right great toe).  Neurological:  Positive for numbness (some baseline neuropathy).     Lab Results  Component Value Date   NA 138 03/14/2024   K 4.4 03/16/2024   CO2 22 03/14/2024   GLUCOSE 190 (H) 03/14/2024   BUN 28 (H) 03/14/2024   CREATININE 1.46 (H) 03/14/2024   CALCIUM  9.9 03/14/2024   EGFR 49 (L) 03/14/2024   GFRNONAA 53 (L) 09/23/2020   Lab Results  Component Value Date   CHOL 152 04/20/2023   HDL 61 04/20/2023   LDLCALC 73 04/20/2023   TRIG 95 04/20/2023   CHOLHDL 2.5 04/20/2023   Lab Results  Component Value Date   TSH 2.000 02/16/2024   Lab Results  Component Value  Date   HGBA1C 8.0 (H) 10/21/2023   Lab Results  Component Value Date   WBC 11.0 (H) 03/16/2024   HGB 16.6 03/16/2024   HCT 50.9 03/16/2024   MCV 87.5 03/16/2024   PLT 475 (H) 03/16/2024   Lab Results  Component Value Date   ALT 26 04/20/2023   AST 23 04/20/2023   BILITOT 0.5 04/20/2023   No results found for: MARIEN BOLLS, VD25OH   Patient Active Problem List   Diagnosis Date Noted   Persistent atrial fibrillation (HCC) 03/22/2024   SOB (shortness of breath) 03/22/2024   Coronary artery calcification 12/28/2023   Thyroid  nodule 06/11/2021   Atherosclerosis of aorta 01/09/2021   Multiple pulmonary nodules 01/09/2021   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/2020   Hyperlipidemia associated with type 2 diabetes mellitus (HCC) 04/02/2020   Heart murmur, systolic 04/02/2020   Diabetic cataract of both eyes (HCC) 12/28/2019   Elevated PSA 12/22/2019   Benign prostatic hyperplasia with urinary frequency 12/22/2019   Benign hypertension with CKD (chronic kidney disease) stage III (HCC) 11/30/2019   History of SCC (squamous cell carcinoma) of skin 11/30/2019   AK (actinic keratosis) 11/30/2019   Stage 3a chronic kidney disease (HCC) 11/30/2019   Type 2 diabetes mellitus with other specified complication (HCC) 11/30/2019    No Known Allergies  Past Surgical History:  Procedure Laterality Date   CARDIOVERSION N/A 03/22/2024   Procedure: CARDIOVERSION;  Surgeon: Perla Evalene PARAS, MD;  Location: [facility] ORS;  Service: Cardiovascular;  Laterality: N/A;   CATARACT EXTRACTION Bilateral 2024   SHOULDER SURGERY     Right side    Social History   Tobacco Use   Smoking status: Former    Current packs/day: 0.00    Average packs/day: 1 pack/day for 10.0 years (10.0 ttl pk-yrs)    Types: Cigarettes    Start date: 07/06/1964    Quit date: 07/06/1974    Years since quitting: 49.7   Smokeless tobacco: Never  Vaping Use   Vaping status: Never Used  Substance Use Topics   Alcohol  use: Yes    Alcohol/week: 1.0 standard drink of alcohol    Types: 1 Standard drinks or equivalent per week   Drug use: Never     Medication list has been reviewed and updated.  Current Meds  Medication Sig   amoxicillin -clavulanate (AUGMENTIN ) 875-125 MG tablet Take 1 tablet by mouth 2 (two) times daily for 10 days.   apixaban  (ELIQUIS ) 5 MG TABS tablet Take 1 tablet (5 mg total) by mouth 2 (two) times daily.   atorvastatin  (LIPITOR) 10 MG tablet Take 1 tablet (10 mg total) by mouth daily.   Cholecalciferol (D3-1000) 25 MCG (1000 UT) tablet Take 1,000 Units by mouth every evening.   Continuous Glucose Sensor (DEXCOM G7 SENSOR) MISC USE TO CHECK BLOOD SUGAR.  CHANGE SENSOR EVERY 10     DAYS.   D-MANNOSE PO Take 1 tablet by mouth in the morning.   esomeprazole  (NEXIUM ) 40 MG capsule Take 1 capsule (40 mg total) by mouth daily.   ezetimibe  (ZETIA ) 10 MG tablet Take 1 tablet (10 mg total) by mouth daily.   furosemide  (LASIX ) 20 MG tablet Take 1 tablet (20 mg total) by mouth every other day. You may take an additional 20 mg tablet once per day IF you have overnight weight gain of more than 3 pounds or more than 5 pounds in a week.   Magnesium  400 MG TABS Take 400 mg by mouth in the morning.   metoprolol  succinate (TOPROL -XL) 50 MG 24 hr tablet Take 1.5 tablets (75 mg total) by mouth in the morning and at bedtime. Take with or immediately following a meal.   TRIJARDY  XR 25-11-998 MG TB24 TAKE 1 TABLET DAILY       03/31/2024   11:12 AM 02/08/2024    4:05 PM 10/28/2023    8:49 AM 04/22/2023    9:51 AM  GAD 7 : Generalized Anxiety Score  Nervous, Anxious, on Edge 0 0 1 0  Control/stop worrying 0 0 1 0  Worry too much - different things 0 0 0 0  Trouble relaxing 0 0 0 0  Restless 0 0 0 0  Easily annoyed or irritable 0 0 0 0  Afraid - awful might happen 0 0 0 0  Total GAD 7 Score 0 0 2 0  Anxiety Difficulty Not difficult at all  Not difficult at all        03/31/2024   11:12 AM 02/08/2024     4:05 PM 02/08/2024    4:04 PM  Depression screen PHQ 2/9  Decreased Interest 0 0 0  Down, Depressed, Hopeless 0 0 0  PHQ - 2 Score 0 0 0  Altered sleeping 1 1   Tired, decreased energy 1 2   Change in appetite 0 0   Feeling bad or failure about yourself  0 0   Trouble concentrating 0 0   Moving slowly or fidgety/restless  0 0   Suicidal thoughts 0 0   PHQ-9 Score 2 3   Difficult doing work/chores Not difficult at all      BP Readings from Last 3 Encounters:  03/31/24 112/74  03/30/24 128/70  03/22/24 101/71    Physical Exam Vitals and nursing note reviewed.  Constitutional:      General: He is not in acute distress.    Appearance: Normal appearance. He is well-developed.  HENT:     Head: Normocephalic and atraumatic.  Cardiovascular:     Rate and Rhythm: Normal rate. Rhythm irregularly irregular.     Pulses:          Dorsalis pedis pulses are 1+ on the right side.  Pulmonary:     Effort: Pulmonary effort is normal. No respiratory distress.     Breath sounds: No wheezing or rhonchi.  Musculoskeletal:     Right lower leg: No edema.  Skin:    General: Skin is warm and dry.     Findings: No rash.     Comments: Redness, warmth and swelling around the lateral right great toenail.  No drainage or fluctuance.  Neurological:     Mental Status: He is alert and oriented to person, place, and time.  Psychiatric:        Mood and Affect: Mood normal.        Behavior: Behavior normal.     Lab Results  Component Value Date   COLORU yellow 03/31/2024   CLARITYU cloudy 03/31/2024   GLUCOSEUR Positive (A) 03/31/2024   BILIRUBINUR neg 03/31/2024   KETONESU neg 03/31/2024   SPECGRAV 1.015 03/31/2024   RBCUR neg 03/31/2024   PHUR 5.0 03/31/2024   PROTEINUR Negative 03/31/2024   UROBILINOGEN 0.2 03/31/2024   LEUKOCYTESUR Negative 03/31/2024    Wt Readings from Last 3 Encounters:  03/31/24 181 lb (82.1 kg)  03/30/24 182 lb (82.6 kg)  03/22/24 185 lb (83.9 kg)    BP  112/74   Pulse (!) 107   Ht 5' 10 (1.778 m)   Wt 181 lb (82.1 kg)   SpO2 99%   BMI 25.97 kg/m   Assessment and Plan:  Problem List Items Addressed This Visit   None Visit Diagnoses       Paronychia, toe, right    -  Primary   recommend daily monitoring for worsening but no topical treatment needed augmentin  x 10 days follow up with PCP if worsening   Relevant Medications   amoxicillin -clavulanate (AUGMENTIN ) 875-125 MG tablet     Dysuria       UA dipstick negative will send for culture to rule out fungal UTI   Relevant Orders   POCT urinalysis dipstick (Completed)   Urine Culture       No follow-ups on file.    Leita HILARIO Adie, MD [facility] [place]

## 2024-04-02 ENCOUNTER — Ambulatory Visit: Payer: Self-pay | Admitting: Internal Medicine

## 2024-04-02 LAB — URINE CULTURE

## 2024-04-03 ENCOUNTER — Other Ambulatory Visit: Payer: Self-pay | Admitting: Family Medicine

## 2024-04-03 DIAGNOSIS — E1169 Type 2 diabetes mellitus with other specified complication: Secondary | ICD-10-CM

## 2024-04-03 DIAGNOSIS — E1165 Type 2 diabetes mellitus with hyperglycemia: Secondary | ICD-10-CM

## 2024-04-04 NOTE — Progress Notes (Unsigned)
  Electrophysiology Office Note:    Date:  04/04/2024   ID:  Jorge Mayer, DOB 06/29/1946, MRN [id number]  [facility] Cardiologist:  Evalene Lunger, MD  [facility] Electrophysiologist:  OLE ONEIDA HOLTS, MD   Referring MD: Abigail Bernardino HERO, PA-C   Chief Complaint: Atrial fibrillation  History of Present Illness:    Jorge Mayer is a 78 year old man who I am seeing today for an evaluation of atrial fibrillation at the request of Bernardino Abigail, PA-C.  The patient has a history of atrial fibrillation, hypertension, CKD, diabetes, aortic atherosclerosis and stroke.  he is on Eliquis  for stroke prophylaxis.  At the last appointment with Bernardino, the patient was in atrial fibrillation with a ventricular rate of 114 bpm.  The patient's atrial fibrillation is thought to be contributing to his diastolic heart failure.      Their past medical, social and family history was reviewed.   ROS:   Please see the history of present illness.    All other systems reviewed and are negative.  EKGs/Labs/Other Studies Reviewed:    The following studies were reviewed today:  March 10, 2024 echo EF 55-60 RV low normal Mild Jorge Mild TR Mild to moderate AI  February 08, 2024 ZIO monitor 65% burden of atrial fibrillation, average heart rate 120 bpm         Physical Exam:    VS:  There were no vitals taken for this visit.    Wt Readings from Last 3 Encounters:  03/31/24 181 lb (82.1 kg)  03/30/24 182 lb (82.6 kg)  03/22/24 185 lb (83.9 kg)     GEN: no distress CARD: Irregularly irregular, No MRG RESP: No IWOB. CTAB.        ASSESSMENT AND PLAN:    No diagnosis found.  #Paroxysmal atrial fibrillation Asymptomatic. On Eliquis  for stroke prophylaxis Continue metoprolol  succinate.  I did discuss treatment options with the patient during today's clinic appointment including conservative management, antiarrhythmic drugs and catheter ablation.  antiarrhythmic options would be limited  to amiodarone given abnormal kidney function.  ***Amiodarone or ablation?  #Coronary artery disease #Aortic atherosclerosis Continue atorvastatin     Signed, OLE T. HOLTS, MD, [facility], [facility] 04/04/2024 8:29 PM    Electrophysiology [facility]

## 2024-04-05 ENCOUNTER — Ambulatory Visit: Attending: Cardiology | Admitting: Cardiology

## 2024-04-05 ENCOUNTER — Encounter: Payer: Self-pay | Admitting: Cardiology

## 2024-04-05 ENCOUNTER — Other Ambulatory Visit: Payer: Self-pay

## 2024-04-05 VITALS — BP 98/70 | HR 90 | Ht 70.0 in | Wt 179.0 lb

## 2024-04-05 DIAGNOSIS — I700 Atherosclerosis of aorta: Secondary | ICD-10-CM | POA: Diagnosis not present

## 2024-04-05 DIAGNOSIS — I480 Paroxysmal atrial fibrillation: Secondary | ICD-10-CM | POA: Diagnosis not present

## 2024-04-05 DIAGNOSIS — I2510 Atherosclerotic heart disease of native coronary artery without angina pectoris: Secondary | ICD-10-CM | POA: Diagnosis not present

## 2024-04-05 MED ORDER — AMIODARONE HCL 200 MG PO TABS: ORAL_TABLET | ORAL | 3 refills | Status: DC

## 2024-04-05 NOTE — Patient Instructions (Addendum)
 Medication Instructions:  Your physician has recommended you make the following change in your medication:  1) START taking amiodarone 200 mg twice daily for 14 days, then decrease to 200 mg once daily thereafter  *If you need a refill on your cardiac medications before your next appointment, please call your pharmacy*  Lab Work: TODAY: CMET, TSH, T4, CBC  Testing/Procedures: Cardioversion  Your physician has recommended that you have a Cardioversion (DCCV). Electrical Cardioversion uses a jolt of electricity to your heart either through paddles or wired patches attached to your chest. This is a controlled, usually prescheduled, procedure. Defibrillation is done under light anesthesia in the hospital, and you usually go home the day of the procedure. This is done to get your heart back into a normal rhythm. You are not awake for the procedure. Please see the instruction sheet given to you today.  Follow-Up: At [facility], you and your health needs are our priority.  As part of our continuing mission to provide you with exceptional heart care, our providers are all part of one team.  This team includes your primary Cardiologist (physician) and Advanced Practice Providers or APPs (Physician Assistants and Nurse Practitioners) who all work together to provide you with the care you need, when you need it.  Your next appointment:   3 months  Provider:   Suzann Riddle, NP

## 2024-04-05 NOTE — Addendum Note (Signed)
 Addended by: CHAUVIGNE, Ryann Leavitt on: 04/05/2024 10:54 AM   Modules accepted: Orders

## 2024-04-05 NOTE — Telephone Encounter (Signed)
 Requested medication (s) are due for refill today: yes  Requested medication (s) are on the active medication list: yes  Last refill:  03/29/23  Future visit scheduled: yes  Notes to clinic:  Medication not assigned to a protocol, review manually.      Requested Prescriptions  Pending Prescriptions Disp Refills   TRIJARDY  XR 25-11-998 MG TB24 [Pharmacy Med Name: TRIJARDYXR1G TAB 25-5 MG] 90 tablet 3    Sig: TAKE 1 TABLET DAILY     Off-Protocol Failed - 04/05/2024  1:02 PM      Failed - Medication not assigned to a protocol, review manually.      Passed - Valid encounter within last 12 months    Recent Outpatient Visits           5 days ago Paronychia, toe, right   [place] [facility], Leita DEL, MD   1 month ago Paroxysmal atrial fibrillation [facility])   [place] [facility] [place], Marsa PARAS, DO   5 months ago Type 2 diabetes mellitus with other specified complication, without long-term current use of insulin [facility])   [place] [facility] [place], Marsa PARAS, DO       Future Appointments             In 3 weeks Edman, Marsa PARAS, DO [place] [facility], [place]   In 1 month Perla, Evalene PARAS, MD [place] [facility] [place]   In 2 months Dunn, Bernardino HERO, PA-C [place] [facility] [place]   In 3 months Riddle, Suzann, NP [place] [facility] [place]   In 10 months Hester Alm BROCKS, MD [facility]             Continuous Glucose Sensor (DEXCOM G7 SENSOR) MISC [Pharmacy Med Name: DEXCOM G7 MIS SENSOR] 9 each 1    Sig: USE TO CHECK BLOOD SUGAR.  CHANGE SENSOR EVERY 10     DAYS.     Endocrinology: Diabetes - Testing Supplies Passed - 04/05/2024  1:02 PM      Passed - Valid encounter within last 12 months    Recent Outpatient Visits           5 days ago Paronychia, toe, right   [facility] at [facility], Leita DEL, MD   1 month ago Paroxysmal atrial fibrillation [facility])   [place] [facility] [place], Marsa PARAS, DO   5 months ago Type 2 diabetes mellitus with other specified complication, without long-term current use of insulin [facility])   [place] [facility] [place], Marsa PARAS, DO       Future Appointments             In 3 weeks Edman, Marsa PARAS, DO [place] [facility], [place]   In 1 month Gollan, Timothy J, MD [facility] at [place]   In 2 months Dunn, Bernardino HERO, PA-C [place] [facility] at [place]   In 3 months Riddle, Suzann, NP [place] [facility] at [address]   In 10 months Hester Alm BROCKS, MD [facility]

## 2024-04-06 ENCOUNTER — Ambulatory Visit: Payer: Self-pay | Admitting: Cardiology

## 2024-04-06 DIAGNOSIS — I480 Paroxysmal atrial fibrillation: Secondary | ICD-10-CM

## 2024-04-06 DIAGNOSIS — I2510 Atherosclerotic heart disease of native coronary artery without angina pectoris: Secondary | ICD-10-CM

## 2024-04-06 LAB — CBC
Hematocrit: 53.5 % — ABNORMAL HIGH (ref 37.5–51.0)
Hemoglobin: 16.8 g/dL (ref 13.0–17.7)
MCH: 28 pg (ref 26.6–33.0)
MCHC: 31.4 g/dL — ABNORMAL LOW (ref 31.5–35.7)
MCV: 89 fL (ref 79–97)
Platelets: 508 x10E3/uL — ABNORMAL HIGH (ref 150–450)
RBC: 6.01 x10E6/uL — ABNORMAL HIGH (ref 4.14–5.80)
RDW: 13.8 % (ref 11.6–15.4)
WBC: 10.3 x10E3/uL (ref 3.4–10.8)

## 2024-04-06 LAB — TSH: TSH: 2.76 u[IU]/mL (ref 0.450–4.500)

## 2024-04-06 LAB — COMPREHENSIVE METABOLIC PANEL WITH GFR
ALT: 21 IU/L (ref 0–44)
AST: 26 IU/L (ref 0–40)
Albumin: 4.4 g/dL (ref 3.8–4.8)
Alkaline Phosphatase: 59 IU/L (ref 47–123)
BUN/Creatinine Ratio: 14 (ref 10–24)
BUN: 23 mg/dL (ref 8–27)
Bilirubin Total: 0.6 mg/dL (ref 0.0–1.2)
CO2: 23 mmol/L (ref 20–29)
Calcium: 10 mg/dL (ref 8.6–10.2)
Chloride: 98 mmol/L (ref 96–106)
Creatinine, Ser: 1.63 mg/dL — ABNORMAL HIGH (ref 0.76–1.27)
Globulin, Total: 2.5 g/dL (ref 1.5–4.5)
Glucose: 185 mg/dL — ABNORMAL HIGH (ref 70–99)
Potassium: 5.6 mmol/L — ABNORMAL HIGH (ref 3.5–5.2)
Sodium: 139 mmol/L (ref 134–144)
Total Protein: 6.9 g/dL (ref 6.0–8.5)
eGFR: 43 mL/min/1.73 — ABNORMAL LOW (ref >59)

## 2024-04-06 LAB — T4, FREE: Free T4: 1.4 ng/dL (ref 0.82–1.77)

## 2024-04-11 ENCOUNTER — Other Ambulatory Visit: Payer: Self-pay

## 2024-04-11 DIAGNOSIS — I480 Paroxysmal atrial fibrillation: Secondary | ICD-10-CM

## 2024-04-11 DIAGNOSIS — I2510 Atherosclerotic heart disease of native coronary artery without angina pectoris: Secondary | ICD-10-CM

## 2024-04-12 ENCOUNTER — Ambulatory Visit: Payer: Self-pay | Admitting: Cardiology

## 2024-04-12 LAB — BASIC METABOLIC PANEL WITH GFR
BUN/Creatinine Ratio: 13 (ref 10–24)
BUN: 23 mg/dL (ref 8–27)
CO2: 26 mmol/L (ref 20–29)
Calcium: 9.9 mg/dL (ref 8.6–10.2)
Chloride: 96 mmol/L (ref 96–106)
Creatinine, Ser: 1.77 mg/dL — ABNORMAL HIGH (ref 0.76–1.27)
Glucose: 123 mg/dL — ABNORMAL HIGH (ref 70–99)
Potassium: 5.1 mmol/L (ref 3.5–5.2)
Sodium: 136 mmol/L (ref 134–144)
eGFR: 39 mL/min/1.73 — ABNORMAL LOW (ref >59)

## 2024-04-17 ENCOUNTER — Other Ambulatory Visit

## 2024-04-17 DIAGNOSIS — E041 Nontoxic single thyroid nodule: Secondary | ICD-10-CM

## 2024-04-17 DIAGNOSIS — N401 Enlarged prostate with lower urinary tract symptoms: Secondary | ICD-10-CM

## 2024-04-17 DIAGNOSIS — E1169 Type 2 diabetes mellitus with other specified complication: Secondary | ICD-10-CM

## 2024-04-17 DIAGNOSIS — Z0000 Encounter for general adult medical examination without abnormal findings: Secondary | ICD-10-CM

## 2024-04-17 DIAGNOSIS — R972 Elevated prostate specific antigen [PSA]: Secondary | ICD-10-CM

## 2024-04-17 DIAGNOSIS — I129 Hypertensive chronic kidney disease with stage 1 through stage 4 chronic kidney disease, or unspecified chronic kidney disease: Secondary | ICD-10-CM

## 2024-04-18 LAB — COMPREHENSIVE METABOLIC PANEL WITH GFR
AG Ratio: 1.7 (calc) (ref 1.0–2.5)
ALT: 14 U/L (ref 9–46)
AST: 16 U/L (ref 10–35)
Albumin: 4 g/dL (ref 3.6–5.1)
Alkaline phosphatase (APISO): 45 U/L (ref 35–144)
BUN/Creatinine Ratio: 14 (calc) (ref 6–22)
BUN: 23 mg/dL (ref 7–25)
CO2: 31 mmol/L (ref 20–32)
Calcium: 9.3 mg/dL (ref 8.6–10.3)
Chloride: 99 mmol/L (ref 98–110)
Creat: 1.67 mg/dL — ABNORMAL HIGH (ref 0.70–1.28)
Globulin: 2.4 g/dL (ref 1.9–3.7)
Glucose, Bld: 144 mg/dL — ABNORMAL HIGH (ref 65–99)
Potassium: 5.1 mmol/L (ref 3.5–5.3)
Sodium: 137 mmol/L (ref 135–146)
Total Bilirubin: 0.8 mg/dL (ref 0.2–1.2)
Total Protein: 6.4 g/dL (ref 6.1–8.1)
eGFR: 41 mL/min/1.73m2 — ABNORMAL LOW (ref >=60)

## 2024-04-18 LAB — CBC WITH DIFFERENTIAL/PLATELET
Absolute Lymphocytes: 1336 {cells}/uL (ref 850–3900)
Absolute Monocytes: 877 {cells}/uL (ref 200–950)
Basophils Absolute: 82 {cells}/uL (ref 0–200)
Basophils Relative: 0.8 %
Eosinophils Absolute: 163 {cells}/uL (ref 15–500)
Eosinophils Relative: 1.6 %
HCT: 49.7 % (ref 38.5–50.0)
Hemoglobin: 16.1 g/dL (ref 13.2–17.1)
MCH: 28.7 pg (ref 27.0–33.0)
MCHC: 32.4 g/dL (ref 32.0–36.0)
MCV: 88.6 fL (ref 80.0–100.0)
MPV: 10.7 fL (ref 7.5–12.5)
Monocytes Relative: 8.6 %
Neutro Abs: 7742 {cells}/uL (ref 1500–7800)
Neutrophils Relative %: 75.9 %
Platelets: 426 Thousand/uL — ABNORMAL HIGH (ref 140–400)
RBC: 5.61 Million/uL (ref 4.20–5.80)
RDW: 14.1 % (ref 11.0–15.0)
Total Lymphocyte: 13.1 %
WBC: 10.2 Thousand/uL (ref 3.8–10.8)

## 2024-04-18 LAB — LIPID PANEL
Cholesterol: 128 mg/dL (ref <200)
HDL: 54 mg/dL (ref >=40)
LDL Cholesterol (Calc): 60 mg/dL
Non-HDL Cholesterol (Calc): 74 mg/dL (ref <130)
Total CHOL/HDL Ratio: 2.4 (calc) (ref <5.0)
Triglycerides: 63 mg/dL (ref <150)

## 2024-04-18 LAB — TSH: TSH: 3.41 m[IU]/L (ref 0.40–4.50)

## 2024-04-18 LAB — MICROALBUMIN / CREATININE URINE RATIO
Creatinine, Urine: 102 mg/dL (ref 20–320)
Microalb Creat Ratio: 12 mg/g{creat} (ref <30)
Microalb, Ur: 1.2 mg/dL

## 2024-04-18 LAB — PSA: PSA: 2.7 ng/mL (ref <=4.00)

## 2024-04-18 LAB — HEMOGLOBIN A1C
Hgb A1c MFr Bld: 8.1 % — ABNORMAL HIGH (ref <5.7)
Mean Plasma Glucose: 186 mg/dL
eAG (mmol/L): 10.3 mmol/L

## 2024-04-18 LAB — T4, FREE: Free T4: 1.4 ng/dL (ref 0.8–1.8)

## 2024-04-24 LAB — OPHTHALMOLOGY REPORT-SCANNED

## 2024-04-26 ENCOUNTER — Ambulatory Visit: Admitting: Family Medicine

## 2024-04-26 ENCOUNTER — Encounter: Payer: Self-pay | Admitting: Family Medicine

## 2024-04-26 VITALS — BP 124/78 | HR 79 | Ht 70.0 in | Wt 184.0 lb

## 2024-04-26 DIAGNOSIS — E1169 Type 2 diabetes mellitus with other specified complication: Secondary | ICD-10-CM | POA: Diagnosis not present

## 2024-04-26 DIAGNOSIS — I129 Hypertensive chronic kidney disease with stage 1 through stage 4 chronic kidney disease, or unspecified chronic kidney disease: Secondary | ICD-10-CM | POA: Diagnosis not present

## 2024-04-26 DIAGNOSIS — N401 Enlarged prostate with lower urinary tract symptoms: Secondary | ICD-10-CM

## 2024-04-26 DIAGNOSIS — I480 Paroxysmal atrial fibrillation: Secondary | ICD-10-CM

## 2024-04-26 DIAGNOSIS — E785 Hyperlipidemia, unspecified: Secondary | ICD-10-CM

## 2024-04-26 DIAGNOSIS — R350 Frequency of micturition: Secondary | ICD-10-CM

## 2024-04-26 DIAGNOSIS — N1832 Chronic kidney disease, stage 3b: Secondary | ICD-10-CM | POA: Insufficient documentation

## 2024-04-26 DIAGNOSIS — Z7984 Long term (current) use of oral hypoglycemic drugs: Secondary | ICD-10-CM

## 2024-04-26 DIAGNOSIS — N183 Chronic kidney disease, stage 3 unspecified: Secondary | ICD-10-CM

## 2024-04-26 MED ORDER — GVOKE HYPOPEN 2-PACK 1 MG/0.2ML ~~LOC~~ SOAJ: 1.0000 mg | SUBCUTANEOUS | 2 refills | Status: AC | PRN

## 2024-04-26 NOTE — Progress Notes (Signed)
 Subjective:    Patient ID: Jorge Mayer, male    DOB: 1946/05/13, 78 y.o.   MRN: [id number]  Jorge Mayer is a 78 y.o. male presenting on 04/26/2024 for Annual Exam   HPI  Discussed the use of AI scribe software for clinical note transcription with the patient, who gave verbal consent to proceed.  History of Present Illness   Jorge Mayer is a 78 year old male with atrial fibrillation and chronic kidney disease who presents for an annual physical exam.  Atrial fibrillation - Persistent atrial fibrillation since August - Scheduled for cardioversion on October 29th - Previous cardioversion was initially successful, but arrhythmia recurred the same night On Amiodarone currently  Chronic kidney disease III b - Stable kidney function with elevated creatinine levels in blood, baseline 1.6-1.7 now some decline over past 6 months - Concern regarding impact of medications, particularly Lasix , on kidney function - Takes Lasix  every other day and is also on Jardiance - History of diabetes and hypertension, both contributing factors to kidney disease - Previously with Nephrology, no longer  Type 2 Diabetes Glycemic control - Blood sugar levels are fluctuating Uses CGM - Recent episodes of hypoglycemia, with a low of 45 mg/dL - Manages hypoglycemia with juice - Uses a continuous glucose monitor - Adjusting diet to avoid foods that spike blood sugar, such as cereal and pancakes - Hemoglobin A1c is 8.1 - Working on dietary adjustments to improve glycemic control - Admits peripheral neuropathy  Hyperlipidemia management - Currently taking atorvastatin  and Zetia  for cholesterol management       Health Maintenance:  PSA 2.7 previous 2.72     04/26/2024   10:03 AM 03/31/2024   11:12 AM 02/08/2024    4:05 PM  Depression screen PHQ 2/9  Decreased Interest 0 0 0  Down, Depressed, Hopeless 0 0 0  PHQ - 2 Score 0 0 0  Altered sleeping 0 1 1  Tired, decreased energy 0 1 2   Change in appetite 0 0 0  Feeling bad or failure about yourself  0 0 0  Trouble concentrating 0 0 0  Moving slowly or fidgety/restless 0 0 0  Suicidal thoughts 0 0 0  PHQ-9 Score 0 2 3  Difficult doing work/chores  Not difficult at all        04/26/2024   10:04 AM 03/31/2024   11:12 AM 02/08/2024    4:05 PM 10/28/2023    8:49 AM  GAD 7 : Generalized Anxiety Score  Nervous, Anxious, on Edge 0 0 0 1  Control/stop worrying 0 0 0 1  Worry too much - different things 0 0 0 0  Trouble relaxing 0 0 0 0  Restless 0 0 0 0  Easily annoyed or irritable 0 0 0 0  Afraid - awful might happen 0 0 0 0  Total GAD 7 Score 0 0 0 2  Anxiety Difficulty Not difficult at all Not difficult at all  Not difficult at all     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   left upper quad abdomen, mod to severe, excised 04/22/21   GERD (gastroesophageal reflux disease)    Heart murmur 2024   Hypertension    Prostate disease    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, txted in [place]    Stroke (HCC)  Urinary incontinence    Past Surgical History:  Procedure Laterality Date   CARDIOVERSION N/A 03/22/2024   Procedure: CARDIOVERSION;  Surgeon: Perla Evalene PARAS, MD;  Location: [facility] ORS;  Service: Cardiovascular;  Laterality: N/A;   CATARACT EXTRACTION Bilateral 2024   SHOULDER SURGERY     Right side   Social History   Socioeconomic History   Marital status: Married    Spouse name: Not on file   Number of children: Not on file   Years of education: Not on file   Highest education level: Not on file  Occupational History   Not on file  Tobacco Use   Smoking status: Former    Current packs/day: 0.00    Average packs/day: 1 pack/day for 10.0 years (10.0 ttl pk-yrs)    Types: Cigarettes    Start date: 07/06/1964    Quit date: 07/06/1974    Years since  quitting: 49.8   Smokeless tobacco: Never  Vaping Use   Vaping status: Never Used  Substance and Sexual Activity   Alcohol use: Yes    Alcohol/week: 1.0 standard drink of alcohol    Types: 1 Standard drinks or equivalent per week   Drug use: Never   Sexual activity: Not on file  Other Topics Concern   Not on file  Social History Narrative   Not on file   Social Drivers of Health   Financial Resource Strain: Not on file  Food Insecurity: Not on file  Transportation Needs: Not on file  Physical Activity: Not on file  Stress: Not on file  Social Connections: Not on file  Intimate Partner Violence: Not on file   Family History  Problem Relation Age of Onset   Heart disease Mother        Died at 52   Heart disease Father        Stroke at 59   Lung cancer Brother 99   Current Outpatient Medications on File Prior to Visit  Medication Sig   amiodarone (PACERONE) 200 MG tablet Take 1 tablet (200 mg total) by mouth 2 (two) times daily for 14 days, THEN 1 tablet (200 mg total) daily.   apixaban  (ELIQUIS ) 5 MG TABS tablet Take 1 tablet (5 mg total) by mouth 2 (two) times daily.   atorvastatin  (LIPITOR) 10 MG tablet Take 1 tablet (10 mg total) by mouth daily.   Cholecalciferol (D3-1000) 25 MCG (1000 UT) tablet Take 1,000 Units by mouth every evening.   Continuous Glucose Sensor (DEXCOM G7 SENSOR) MISC USE TO CHECK BLOOD SUGAR.  CHANGE SENSOR EVERY 10     DAYS.   D-MANNOSE PO Take 1 tablet by mouth in the morning.   esomeprazole  (NEXIUM ) 40 MG capsule Take 1 capsule (40 mg total) by mouth daily.   ezetimibe  (ZETIA ) 10 MG tablet Take 1 tablet (10 mg total) by mouth daily.   furosemide  (LASIX ) 20 MG tablet Take 1 tablet (20 mg total) by mouth every other day. You may take an additional 20 mg tablet once per day IF you have overnight weight gain of more than 3 pounds or more than 5 pounds in a week.   gabapentin  (NEURONTIN ) 100 MG capsule Take 100 mg by mouth as needed.   Magnesium  400 MG  TABS Take 400 mg by mouth in the morning.   metoprolol  succinate (TOPROL -XL) 50 MG 24 hr tablet Take 1.5 tablets (75 mg total) by mouth in the morning and at bedtime. Take with or immediately following a meal.  TRIJARDY  XR 25-11-998 MG TB24 TAKE 1 TABLET DAILY   No current facility-administered medications on file prior to visit.    Review of Systems Per HPI unless specifically indicated above     Objective:    BP 124/78 (BP Location: Left Arm, Patient Position: Sitting, Cuff Size: Normal)   Pulse 79   Ht 5' 10 (1.778 m)   Wt 184 lb (83.5 kg)   SpO2 95%   BMI 26.40 kg/m   Wt Readings from Last 3 Encounters:  04/26/24 184 lb (83.5 kg)  04/05/24 179 lb (81.2 kg)  03/31/24 181 lb (82.1 kg)    Physical Exam Vitals and nursing note reviewed.  Constitutional:      General: He is not in acute distress.    Appearance: He is well-developed. He is not diaphoretic.     Comments: Well-appearing, comfortable, cooperative  HENT:     Head: Normocephalic and atraumatic.  Eyes:     General:        Right eye: No discharge.        Left eye: No discharge.     Conjunctiva/sclera: Conjunctivae normal.     Pupils: Pupils are equal, round, and reactive to light.  Neck:     Thyroid : No thyromegaly.  Cardiovascular:     Rate and Rhythm: Normal rate. Rhythm irregular.     Pulses: Normal pulses.     Heart sounds: Normal heart sounds. No murmur heard. Pulmonary:     Effort: Pulmonary effort is normal. No respiratory distress.     Breath sounds: Normal breath sounds. No wheezing or rales.  Abdominal:     General: Bowel sounds are normal. There is no distension.     Palpations: Abdomen is soft. There is no mass.     Tenderness: There is no abdominal tenderness.  Musculoskeletal:        General: No tenderness. Normal range of motion.     Cervical back: Normal range of motion and neck supple.     Comments: Upper / Lower Extremities: - Normal muscle tone, strength bilateral upper extremities  5/5, lower extremities 5/5  Lymphadenopathy:     Cervical: No cervical adenopathy.  Skin:    General: Skin is warm and dry.     Findings: No erythema or rash.  Neurological:     Mental Status: He is alert and oriented to person, place, and time.     Comments: Distal sensation intact to light touch all extremities  Psychiatric:        Mood and Affect: Mood normal.        Behavior: Behavior normal.        Thought Content: Thought content normal.     Comments: Well groomed, good eye contact, normal speech and thoughts     Diabetic Foot Exam - Simple   Simple Foot Form Diabetic Foot exam was performed with the following findings: Yes 04/26/2024  9:56 AM  Visual Inspection See comments: Yes Sensation Testing See comments: Yes Pulse Check Posterior Tibialis and Dorsalis pulse intact bilaterally: Yes Comments Bilateral reduced monofilament heel hindfoot, callus formation, no ulceration. Right great toe with healing area of previous infection associated with the nailbed now resolving.     Results for orders placed or performed in visit on 04/24/24  OPHTHALMOLOGY REPORT-SCANNED   Collection Time: 04/24/24  2:30 PM  Result Value Ref Range   HM Diabetic Eye Exam No Retinopathy No Retinopathy   A Comment        Assessment &  Plan:   Problem List Items Addressed This Visit     Benign hypertension with CKD (chronic kidney disease) stage III (HCC)   Benign prostatic hyperplasia with urinary frequency   Hyperlipidemia associated with type 2 diabetes mellitus (HCC)   Relevant Medications   GVOKE HYPOPEN 2-PACK 1 MG/0.2ML SOAJ   Stage 3b chronic kidney disease (HCC)   Type 2 diabetes mellitus with other specified complication (HCC) - Primary   Relevant Medications   GVOKE HYPOPEN 2-PACK 1 MG/0.2ML SOAJ   Other Visit Diagnoses       Paroxysmal atrial fibrillation (HCC)         Long term current use of oral hypoglycemic drug            Updated Health Maintenance  information Reviewed recent lab results with patient Encouraged improvement to lifestyle with diet and exercise Goal of weight loss  Atrial fibrillation Persistent atrial fibrillation since August. Previous cardioversion temporarily effective. Current medication includes amiodarone, which can affect thyroid  function. - Proceed with cardioversion on May 03, 2024. - Monitor heart rhythm post-cardioversion. - Consider ablation if cardioversion is ineffective.  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 Blood sugar levels fluctuating, improved with dietary changes. Peripheral neuropathy with foot numbness. Current medications include Jardiance (in [place]) which may need adjustment based on kidney function. - Monitor blood sugar levels closely. - Adjust diet to manage blood sugar. - Order Gvoke auto-injector for emergency hypoglycemia. - Schedule follow-up A1c test in January 2026.  Chronic kidney disease III b Chronic kidney disease with elevated creatinine. Contributing factors include age, diabetes, hypertension, and furosemide  use. Jardiance should aid kidney function. Caution with diuretic Furosemide , advised now taking every other day or less in future, would reduce this if tolerated (if AFib can be controlled) - Continue current medication regimen. - Monitor kidney function regularly. - Avoid nephrotoxic medications if able - Maintain hydration - Consider nephrology referral if kidney function declines. He was previously followed by [facility] Dr Marcelino. But has not followed up since kidneys were stable.  Hyperlipidemia Well-controlled with atorvastatin  and Zetia . Cholesterol levels excellent.  Benign prostatic hyperplasia Well-managed with a PSA level of 2.7.        No orders of the defined types were placed in this encounter.   Meds ordered this encounter  Medications   GVOKE HYPOPEN  2-PACK 1 MG/0.2ML SOAJ    Sig: Inject 1 mg into the skin as needed (hypoglycemia).    Dispense:  1 mL    Refill:  2     Follow up plan: Return in about 3 months (around 07/27/2024) for 3 month lab then 1 week later Follow-up DM, CKD, Cardio updates.  Marsa Officer, DO [facility] 04/26/2024, 9:42 AM

## 2024-04-26 NOTE — Patient Instructions (Addendum)
 Thank you for coming to the office today.  Keep using Dexcom to monitor  Ordered GVOKE emergency pen  Caution with the hypoglycemia events, if too many occur we can discuss and adjust  I think the fluid pill Lasix  is affecting your kidney function, too often dosing can reduced kidney function.  Keep up with Cardiology Cardioversion  DUE for FASTING BLOOD WORK (no food or drink after midnight before the lab appointment, only water or coffee without cream/sugar on the morning of)  SCHEDULE Lab Only visit in the morning at the clinic for lab draw in 3 MONTHS   - Make sure Lab Only appointment is at about 1 week before your next appointment, so that results will be available  For Lab Results, once available within 2-3 days of blood draw, you can can log in to MyChart online to view your results and a brief explanation. Also, we can discuss results at next follow-up visit.   Please schedule a Follow-up Appointment to: Return in about 3 months (around 07/27/2024) for 3 month lab then 1 week later Follow-up DM, CKD, Cardio updates.  If you have any other questions or concerns, please feel free to call the office or send a message through MyChart. You may also schedule an earlier appointment if necessary.  Additionally, you may be receiving a survey about your experience at our office within a few days to 1 week by e-mail or mail. We value your feedback.  Marsa Officer, DO [facility], [place]

## 2024-05-03 ENCOUNTER — Telehealth: Payer: Self-pay | Admitting: Cardiology

## 2024-05-03 ENCOUNTER — Encounter
Admission: RE | Disposition: A | Discharge status: Home / Self Care | Payer: Self-pay | Source: Home / Self Care | Attending: Cardiovascular Disease

## 2024-05-03 ENCOUNTER — Encounter: Payer: Self-pay | Admitting: Anesthesiology

## 2024-05-03 ENCOUNTER — Encounter: Payer: Self-pay | Admitting: Cardiology

## 2024-05-03 ENCOUNTER — Ambulatory Visit
Admission: RE | Admit: 2024-05-03 | Discharge: 2024-05-03 | Disposition: A | Discharge status: Home / Self Care | Attending: Cardiovascular Disease | Admitting: Cardiovascular Disease

## 2024-05-03 ENCOUNTER — Ambulatory Visit: Attending: Cardiology | Admitting: Cardiology

## 2024-05-03 VITALS — BP 120/60 | HR 61 | Ht 70.0 in | Wt 185.6 lb

## 2024-05-03 DIAGNOSIS — I480 Paroxysmal atrial fibrillation: Secondary | ICD-10-CM | POA: Diagnosis present

## 2024-05-03 DIAGNOSIS — Z79899 Other long term (current) drug therapy: Secondary | ICD-10-CM | POA: Insufficient documentation

## 2024-05-03 DIAGNOSIS — I4891 Unspecified atrial fibrillation: Secondary | ICD-10-CM | POA: Diagnosis present

## 2024-05-03 DIAGNOSIS — I5032 Chronic diastolic (congestive) heart failure: Secondary | ICD-10-CM | POA: Insufficient documentation

## 2024-05-03 DIAGNOSIS — Z01818 Encounter for other preprocedural examination: Secondary | ICD-10-CM

## 2024-05-03 HISTORY — PX: CARDIOVERSION: SHX1299

## 2024-05-03 SURGERY — CARDIOVERSION
Anesthesia: General

## 2024-05-03 MED ORDER — METOPROLOL SUCCINATE ER 25 MG PO TB24: 25.0000 mg | ORAL_TABLET | Freq: Two times a day (BID) | ORAL | 3 refills | Status: AC

## 2024-05-03 MED ORDER — SODIUM CHLORIDE 0.9 % IV SOLN: INTRAVENOUS | Status: DC

## 2024-05-03 NOTE — Telephone Encounter (Signed)
 Pt c/o medication issue:  1. Name of Medication: metroprolol succinate 50 mg  24 hrs  2. How are you currently taking this medication (dosage and times per day)? 1.5 pills twice a day  3. Are you having a reaction (difficulty breathing--STAT)? Low heart beat and low oxy  4. What is your medication issue? Was taking 2 and then they changed it to 3 and , pt think 3 is to much.

## 2024-05-03 NOTE — Anesthesia Preprocedure Evaluation (Signed)
 Anesthesia Evaluation  Patient identified by MRN, date of birth, ID band Patient awake    Reviewed: Allergy & Precautions, NPO status , Patient's Chart, lab work & pertinent test results  Airway Mallampati: III  TM Distance: >3 FB Neck ROM: full    Dental  (+) Chipped   Pulmonary neg pulmonary ROS, former smoker   Pulmonary exam normal        Cardiovascular hypertension, + CAD  + dysrhythmias Atrial Fibrillation + Valvular Problems/Murmurs AS      Neuro/Psych CVA, No Residual Symptoms  negative psych ROS   GI/Hepatic Neg liver ROS,GERD  ,,  Endo/Other  negative endocrine ROSdiabetes    Renal/GU Renal disease  negative genitourinary   Musculoskeletal   Abdominal   Peds  Hematology negative hematology ROS (+)   Anesthesia Other Findings Past Medical History: No date: Actinic keratosis No date: Chronic kidney disease 03/12/2021: Dysplastic nevus     Comment:  left upper quad abdomen, mod to severe, excised 04/22/21 No date: GERD (gastroesophageal reflux disease) No date: Hypertension No date: Prostate disease No date: Squamous cell carcinoma of skin     Comment:  scalp and forehead, txted in [place]  No date: Stroke [facility]) No date: Urinary incontinence  Past Surgical History: 2024: CATARACT EXTRACTION; Bilateral No date: SHOULDER SURGERY     Comment:  Right side  BMI    Body Mass Index: 26.54 kg/m      Reproductive/Obstetrics negative OB ROS                              Anesthesia Physical Anesthesia Plan  ASA: 3  Anesthesia Plan: General   Post-op Pain Management:    Induction: Intravenous  PONV Risk Score and Plan: 2 and Propofol  infusion and TIVA  Airway Management Planned: Natural Airway and Nasal Cannula  Additional Equipment:   Intra-op Plan:   Post-operative Plan:   Informed Consent:      Dental Advisory Given  Plan Discussed with: Anesthesiologist,  CRNA and Surgeon  Anesthesia Plan Comments: (Patient consented for risks of anesthesia including but not limited to:  - adverse reactions to medications - risk of airway placement if required - damage to eyes, teeth, lips or other oral mucosa - nerve damage due to positioning  - sore throat or hoarseness - Damage to heart, brain, nerves, lungs, other parts of body or loss of life  Patient voiced understanding and assent.)         Anesthesia Quick Evaluation

## 2024-05-03 NOTE — Telephone Encounter (Signed)
 Patient was scheduled for a cardioversion today but was in NSR when he arrived so it was cancelled. He walked over to our office and spoke with the front desk staff in regards to concerns for shortness of breath and low heart rate. He was scheduled for an appointment this morning with Dr. Cindie. Spoke with the patient who states that his heart rate has been in the 50s. He states that he does become lightheaded and dizzy at times. He states that for the past three days he has had increased shortness of breath with minimal exertion. He denies any weight gain or swelling. He does take Lasix  20 mg every other day and an extra tablet as needed. He states his O2 level has been in the low 90s.  He states that he has been taking metoprolol  75 mg twice daily which he feels is contributing to his symptoms. He was also started on amiodarone at his last office visit 10/1, currently taking 200 mg once daily.

## 2024-05-03 NOTE — Patient Instructions (Addendum)
 Medication Instructions:  Your physician has recommended you make the following change in your medication:  1) DECREASE Metoprolol  to 25 twice daily 2) Take your lasix  (furosemide ) once daily until your weight goes back to 179lbs  *If you need a refill on your cardiac medications before your next appointment, please call your pharmacy*   Follow-Up: At [facility], you and your health needs are our priority.  As part of our continuing mission to provide you with exceptional heart care, our providers are all part of one team.  This team includes your primary Cardiologist (physician) and Advanced Practice Providers or APPs (Physician Assistants and Nurse Practitioners) who all work together to provide you with the care you need, when you need it.  Your next appointment:   3-4 weeks  Provider:   Suzann Riddle, NP

## 2024-05-03 NOTE — Progress Notes (Signed)
 ECG showed NSR so direct current cardioversion cancelled per Dr. Gollan.

## 2024-05-03 NOTE — Progress Notes (Signed)
  Electrophysiology Office Follow up Visit Note:    Date:  05/03/2024   ID:  Jorge Mayer, DOB [birth year], MRN [id number]  PCP:  Edman Marsa PARAS, DO  [facility] Cardiologist:  Evalene Lunger, MD  [facility] Electrophysiologist:  OLE ONEIDA HOLTS, MD    Interval History:     Jorge Mayer is a 78 y.o. male who presents for a follow up visit.   I last saw the patient April 05, 2024.  He has atrial fibrillation.  At the last appointment we started amiodarone.  At that appointment we planned for cardioversion.  He presented for cardioversion today but was in sinus rhythm when he arrived. He thinks he went back into normal rhythm several days ago.  He reports some shortness of breath that has been persistent over the last few days.  He has been drinking a lot of water because he thought he was getting dehydrated from the Lasix .  He has been taking Lasix  daily.  His dry weight is about 179 pounds.  Today he was at 181 pounds at home.       Past medical, surgical, social and family history were reviewed.  ROS:   Please see the history of present illness.    All other systems reviewed and are negative.  EKGs/Labs/Other Studies Reviewed:    The following studies were reviewed today:  Today's EKG shows sinus rhythm with a heart rate of 58 bpm.        Physical Exam:    VS:  BP 120/60 (BP Location: Left Arm, Patient Position: Sitting, Cuff Size: Normal)   Pulse 61 Comment: 65 oximeter  Ht 5' 10 (1.778 m)   Wt 185 lb 9.6 oz (84.2 kg)   SpO2 95%   BMI 26.63 kg/m     Wt Readings from Last 3 Encounters:  05/03/24 185 lb 9.6 oz (84.2 kg)  04/26/24 184 lb (83.5 kg)  04/05/24 179 lb (81.2 kg)     GEN: no distress CARD: RRR, there is a soft diastolic murmur at the left sternal borders.  There is a 2 out of 6 crescendo decrescendo systolic ejection murmur at the upper sternal borders.   RESP: No IWOB. CTAB.      ASSESSMENT:    1. PAF (paroxysmal  atrial fibrillation) (HCC)    PLAN:    In order of problems listed above:  #Atrial fibrillation #High risk med monitoring-amiodarone  persistent. On Eliquis  for stroke prophylaxis He is back in normal rhythm Reduce his metoprolol  to 25 mg by mouth twice daily for better chronotropic he  #Acute on chronic diastolic heart failure I suspect he is mildly volume overloaded I have encouraged him to take his Lasix  daily until his weight returns to 179 pounds He will monitor his shortness of breath symptoms closely.  If he feels like his breathing is getting any worse he will present immediately to the emergency department.  He expressed understanding of this today.  I will have him follow-up with Elvie in about 3 to 4 weeks to reassess his heart rates.  If he continues to be short of breath, he may need repeat echo while in normal rhythm to reassess the valves.   Follow-up with Elvie in 3 to 4 weeks.   Signed, Ole Holts, MD, [facility], [facility] 05/03/2024 10:23 AM    Electrophysiology [facility]

## 2024-05-04 ENCOUNTER — Encounter: Payer: Self-pay | Admitting: Cardiovascular Disease

## 2024-05-07 NOTE — H&P (Addendum)
 Procedure cancelled,  NSR on EKG

## 2024-05-12 ENCOUNTER — Ambulatory Visit: Attending: Emergency Medicine

## 2024-05-12 DIAGNOSIS — R0989 Other specified symptoms and signs involving the circulatory and respiratory systems: Secondary | ICD-10-CM | POA: Insufficient documentation

## 2024-05-15 ENCOUNTER — Ambulatory Visit: Payer: Self-pay | Admitting: Emergency Medicine

## 2024-05-19 ENCOUNTER — Encounter: Payer: Self-pay | Admitting: Cardiovascular Disease

## 2024-05-19 ENCOUNTER — Ambulatory Visit: Attending: Cardiovascular Disease | Admitting: Cardiovascular Disease

## 2024-05-19 VITALS — BP 100/70 | HR 54 | Ht 70.0 in | Wt 176.5 lb

## 2024-05-19 DIAGNOSIS — I2510 Atherosclerotic heart disease of native coronary artery without angina pectoris: Secondary | ICD-10-CM | POA: Diagnosis present

## 2024-05-19 DIAGNOSIS — I350 Nonrheumatic aortic (valve) stenosis: Secondary | ICD-10-CM | POA: Diagnosis present

## 2024-05-19 DIAGNOSIS — I700 Atherosclerosis of aorta: Secondary | ICD-10-CM | POA: Diagnosis present

## 2024-05-19 DIAGNOSIS — J90 Pleural effusion, not elsewhere classified: Secondary | ICD-10-CM | POA: Diagnosis present

## 2024-05-19 DIAGNOSIS — E1169 Type 2 diabetes mellitus with other specified complication: Secondary | ICD-10-CM | POA: Insufficient documentation

## 2024-05-19 DIAGNOSIS — E785 Hyperlipidemia, unspecified: Secondary | ICD-10-CM | POA: Insufficient documentation

## 2024-05-19 DIAGNOSIS — I4819 Other persistent atrial fibrillation: Secondary | ICD-10-CM | POA: Diagnosis present

## 2024-05-19 DIAGNOSIS — R0989 Other specified symptoms and signs involving the circulatory and respiratory systems: Secondary | ICD-10-CM | POA: Diagnosis present

## 2024-05-19 DIAGNOSIS — I480 Paroxysmal atrial fibrillation: Secondary | ICD-10-CM | POA: Diagnosis present

## 2024-05-19 DIAGNOSIS — I5032 Chronic diastolic (congestive) heart failure: Secondary | ICD-10-CM | POA: Insufficient documentation

## 2024-05-19 MED ORDER — AMIODARONE HCL 200 MG PO TABS: 200.0000 mg | ORAL_TABLET | Freq: Every day | ORAL | 3 refills | Status: AC

## 2024-05-19 MED ORDER — APIXABAN 5 MG PO TABS: 5.0000 mg | ORAL_TABLET | Freq: Two times a day (BID) | ORAL | 3 refills | Status: AC

## 2024-05-19 NOTE — Patient Instructions (Addendum)
 Medication Instructions:   If blood pressure runs low at home, Please decrease the metoprolol  succinate down to 25 mg daily  If you need a refill on your cardiac medications before your next appointment, please call your pharmacy.   Lab work: No new labs needed  Testing/Procedures:  Your physician has requested that you have an echocardiogram in one year. Echocardiography is a painless test that uses sound waves to create images of your heart. It provides your doctor with information about the size and shape of your heart and how well your heart's chambers and valves are working.   You may receive an ultrasound enhancing agent through an IV if needed to better visualize your heart during the echo. This procedure takes approximately one hour.  There are no restrictions for this procedure.  This will take place at [address] [facility] [address]) [address], [place] [address]  Please note: We ask at that you not bring children with you during ultrasound (echo/ vascular) testing. Due to room size and safety concerns, children are not allowed in the ultrasound rooms during exams. Our front office staff cannot provide observation of children in our lobby area while testing is being conducted. An adult accompanying a patient to their appointment will only be allowed in the ultrasound room at the discretion of the ultrasound technician under special circumstances. We apologize for any inconvenience.   Your physician has requested that you have a carotid duplex. This test is an ultrasound of the carotid arteries in your neck. It looks at blood flow through these arteries that supply the brain with blood.   Allow one hour for this exam.  There are no restrictions or special instructions.  This will take place at [address] [facility] [address]) [address], [place] [address]  Please note: We ask at that you not bring children with you during ultrasound (echo/ vascular) testing. Due to room  size and safety concerns, children are not allowed in the ultrasound rooms during exams. Our front office staff cannot provide observation of children in our lobby area while testing is being conducted. An adult accompanying a patient to their appointment will only be allowed in the ultrasound room at the discretion of the ultrasound technician under special circumstances. We apologize for any inconvenience.   Follow-Up: At [facility], you and your health needs are our priority.  As part of our continuing mission to provide you with exceptional heart care, we have created designated Provider Care Teams.  These Care Teams include your primary Cardiologist (physician) and Advanced Practice Providers (APPs -  Physician Assistants and Nurse Practitioners) who all work together to provide you with the care you need, when you need it.  You will need a follow up appointment in 12 months  Providers on your designated Care Team:   Lonni Meager, NP Bernardino Bring, PA-C Cadence [place], [place]  COVID-19 Vaccine Information can be found at: [email or web address] For questions related to vaccine distribution or appointments, please email [email or web address] or call [phone].

## 2024-05-19 NOTE — Progress Notes (Addendum)
 Cardiology Office Note  Date:  05/19/2024   ID:  Jorge Mayer, DOB 08/18/1945, MRN [id number]  PCP:  Jorge Marsa PARAS, DO   Chief Complaint  Patient presents with   3 month follow up     Patient c/o shortness of breath with little activity.    HPI:  [name] is a 78 year old gentleman with past medical history of  Essential hypertension Kidney disease Diabetes type 2 Aortic atherosclerosis CVA in 2003, right side deficits Moderate carotid disease on left Who presents for f/u of his elevated calcium  score, diabetes, hyperlipidemia, Atrial fibrillation with RVR  Last office visit June 2025 On last clinic visit noted to be in atrial fibrillation with rate 133 Started on amiodarone, set up for cardioversion but was in normal sinus rhythm October 2025 Metoprolol  was reduced down to 25 twice daily, continued on amiodarone   In follow-up today reports feeling well No regular exercise program, used to do water aerobics  Mild shortness of breath on exertion Lasix  as needed, denies leg swelling Blood pressure low on today's visit, does not check at home Denies chest pain concerning for angina  Uses Kardia mobile device to check rhythm periodically Maintaining normal sinus rhythm as far as he is aware  CT calcium  score November 01, 2023 with aortic atherosclerosis, large hiatal hernia, small pleural effusions Calcium  score 581 predominantly LAD and left circumflex  EKG personally reviewed by myself on todays visit EKG Interpretation Date/Time:  Friday May 19 2024 10:37:49 EST Ventricular Rate:  54 PR Interval:  188 QRS Duration:  74 QT Interval:  496 QTC Calculation: 470 R Axis:   3  Text Interpretation: Sinus bradycardia Low voltage QRS Nonspecific ST abnormality When compared with ECG of 03-May-2024 06:52, No significant change was found Confirmed by Perla Lye [phone]) on 05/19/2024 11:05:25 AM   Echocardiogram performed 2023 was grossly normal  with normal ejection fraction  EKG personally reviewed by myself on todays visit EKG Interpretation Date/Time:  Friday May 19 2024 10:37:49 EST Ventricular Rate:  54 PR Interval:  188 QRS Duration:  74 QT Interval:  496 QTC Calculation: 470 R Axis:   3  Text Interpretation: Sinus bradycardia Low voltage QRS Nonspecific ST abnormality When compared with ECG of 03-May-2024 06:52, No significant change was found Confirmed by Perla Lye [phone]) on 05/19/2024 11:05:25 AM    PMH:   has a past medical history of Actinic keratosis, Chronic kidney disease, Diabetes mellitus without complication (HCC) (2003), Dysplastic nevus (03/12/2021), GERD (gastroesophageal reflux disease), Heart murmur (2024), Hypertension, Prostate disease, Squamous cell carcinoma of skin, Stroke (HCC), and Urinary incontinence.  PSH:    Past Surgical History:  Procedure Laterality Date   CARDIOVERSION N/A 03/22/2024   Procedure: CARDIOVERSION;  Surgeon: Perla Lye PARAS, MD;  Location: [facility] ORS;  Service: Cardiovascular;  Laterality: N/A;   CARDIOVERSION N/A 05/03/2024   Procedure: CARDIOVERSION;  Surgeon: Perla Lye PARAS, MD;  Location: [facility] ORS;  Service: Cardiovascular;  Laterality: N/A;   CATARACT EXTRACTION Bilateral 2024   SHOULDER SURGERY     Right side    Current Outpatient Medications  Medication Sig Dispense Refill   amiodarone (PACERONE) 200 MG tablet Take 200 mg by mouth daily.     apixaban  (ELIQUIS ) 5 MG TABS tablet Take 1 tablet (5 mg total) by mouth 2 (two) times daily. 180 tablet 3   atorvastatin  (LIPITOR) 10 MG tablet Take 1 tablet (10 mg total) by mouth daily. 90 tablet 3   Cholecalciferol (D3-1000) 25 MCG (1000  UT) tablet Take 1,000 Units by mouth every evening.     Continuous Glucose Sensor (DEXCOM G7 SENSOR) MISC USE TO CHECK BLOOD SUGAR.  CHANGE SENSOR EVERY 10     DAYS. 9 each 1   esomeprazole  (NEXIUM ) 40 MG capsule Take 1 capsule (40 mg total) by mouth daily. 90 capsule 3    ezetimibe  (ZETIA ) 10 MG tablet Take 1 tablet (10 mg total) by mouth daily. 90 tablet 3   gabapentin  (NEURONTIN ) 100 MG capsule Take 100 mg by mouth as needed.     GVOKE HYPOPEN 2-PACK 1 MG/0.2ML SOAJ Inject 1 mg into the skin as needed (hypoglycemia). 1 mL 2   Magnesium  400 MG TABS Take 400 mg by mouth in the morning.     metoprolol  succinate (TOPROL  XL) 25 MG 24 hr tablet Take 1 tablet (25 mg total) by mouth in the morning and at bedtime. 180 tablet 3   TRIJARDY  XR 25-11-998 MG TB24 TAKE 1 TABLET DAILY 90 tablet 3   D-MANNOSE PO Take 1 tablet by mouth in the morning. (Patient not taking: Reported on 05/19/2024)     furosemide  (LASIX ) 20 MG tablet Take 1 tablet (20 mg total) by mouth every other day. You may take an additional 20 mg tablet once per day IF you have overnight weight gain of more than 3 pounds or more than 5 pounds in a week. (Patient not taking: Reported on 05/19/2024) 60 tablet 3   No current facility-administered medications for this visit.     Allergies:   Patient has no known allergies.   Social History:  The patient  reports that he quit smoking about 49 years ago. His smoking use included cigarettes. He started smoking about 59 years ago. He has a 10 pack-year smoking history. He has never used smokeless tobacco. He reports current alcohol use of about 1.0 standard drink of alcohol per week. He reports that he does not use drugs.   Family History:   family history includes Heart disease in his father and mother; Lung cancer (age of onset: 34) in his brother.    Review of Systems: Review of Systems  Constitutional: Negative.   HENT: Negative.    Respiratory: Negative.    Cardiovascular: Negative.   Gastrointestinal: Negative.   Musculoskeletal: Negative.   Neurological: Negative.   Psychiatric/Behavioral: Negative.    All other systems reviewed and are negative.   PHYSICAL EXAM: VS:  BP 100/70 (BP Location: Left Arm, Patient Position: Sitting, Cuff Size: Normal)    Pulse (!) 54   Ht 5' 10 (1.778 m)   Wt 176 lb 8 oz (80.1 kg)   BMI 25.33 kg/m  , BMI Body mass index is 25.33 kg/m. Constitutional:  oriented to person, place, and time. No distress.  HENT:  Head: Grossly normal Eyes:  no discharge. No scleral icterus.  Neck: No JVD, no carotid bruits  Cardiovascular: Regular rate and rhythm, 2/6 SEM RSB,  Pulmonary/Chest: Clear to auscultation bilaterally, no wheezes or rails Abdominal: Soft.  no distension.  no tenderness.  Musculoskeletal: Normal range of motion Neurological:  normal muscle tone. Coordination normal. No atrophy Skin: Skin warm and dry Psychiatric: normal affect, pleasant  Recent Labs: 04/17/2024: ALT 14; BUN 23; Creat 1.67; Hemoglobin 16.1; Platelets 426; Potassium 5.1; Sodium 137; TSH 3.41    Lipid Panel Lab Results  Component Value Date   CHOL 128 04/17/2024   HDL 54 04/17/2024   LDLCALC 60 04/17/2024   TRIG 63 04/17/2024  Wt Readings from Last 3 Encounters:  05/19/24 176 lb 8 oz (80.1 kg)  05/03/24 185 lb 9.6 oz (84.2 kg)  04/26/24 184 lb (83.5 kg)     ASSESSMENT AND PLAN:  Problem List Items Addressed This Visit       Cardiology Problems   Persistent atrial fibrillation (HCC)   Relevant Medications   amiodarone (PACERONE) 200 MG tablet   Hyperlipidemia associated with type 2 diabetes mellitus (HCC)   Relevant Medications   amiodarone (PACERONE) 200 MG tablet     Other   Type 2 diabetes mellitus with other specified complication (HCC)   Other Visit Diagnoses       Coronary artery disease involving native coronary artery of native heart without angina pectoris    -  Primary   Relevant Medications   amiodarone (PACERONE) 200 MG tablet   Other Relevant Orders   EKG 12-Lead (Completed)     PAF (paroxysmal atrial fibrillation) (HCC)       Relevant Medications   amiodarone (PACERONE) 200 MG tablet   Other Relevant Orders   EKG 12-Lead (Completed)     Chronic heart failure with preserved  ejection fraction (HCC)       Relevant Medications   amiodarone (PACERONE) 200 MG tablet   Other Relevant Orders   EKG 12-Lead (Completed)     Aortic atherosclerosis       Relevant Medications   amiodarone (PACERONE) 200 MG tablet   Other Relevant Orders   EKG 12-Lead (Completed)     Pleural effusion         Bruit of right carotid artery         Hyperlipidemia, unspecified hyperlipidemia type       Relevant Medications   amiodarone (PACERONE) 200 MG tablet     Aortic valve stenosis, etiology of cardiac valve disease unspecified       Relevant Medications   amiodarone (PACERONE) 200 MG tablet       Atrial fibrillation with RVR Tolerating Eliquis  5 twice daily, metoprolol  succinate, amiodarone 200 daily - Recommend he monitor blood pressure at home and if it continues to run low we could decrease metoprolol  succinate down to 25 daily from twice daily dosing  Coronary calcification Currently with no symptoms of angina. No further workup at this time. Continue current medication regimen.  Cholesterol at goal  Diabetes type 2 with complications A1c running high 8.1 Low carbohydrate diet recommended Recommend he restart his exercise program  Essential hypertension Blood pressure low, management as above, monitor at home and if it continues to run low would decrease metoprolol  succinate down to 25 daily  Carotid stenosis Moderate on left, repeat carotid ultrasound in 1 year  Aortic valve stenosis We will schedule repeat echo in 1 year  Signed, Velinda Lunger, M.D., Ph.D. [facility] [place], [place] [phone]

## 2024-05-30 NOTE — Progress Notes (Unsigned)
 Electrophysiology Clinic Note    Date:  05/31/2024  Patient ID:  Jorge Mayer, DOB 1945-12-30, MRN [id number] PCP:  Edman Marsa PARAS, DO  Cardiologist:  Evalene Lunger, MD  Electrophysiologist:  OLE ONEIDA HOLTS, MD  Electrophysiology APP:  Lissandro Dilorenzo, NP     Discussed the use of AI scribe software for clinical note transcription with the patient, who gave verbal consent to proceed.   Patient Profile    Chief Complaint: Afib follow-up  History of Present Illness: Jorge Mayer is a 78 y.o. male with PMH notable for parox AFib, non-obs CAD, aortic atherosclerosis,HTN, CKD, T2DM, CVA; seen today for OLE ONEIDA HOLTS, MD for routine electrophysiology followup.   He saw Dr. Holts 10/1 for consult of atrial fibrillation.  At that visit amiodarone  was started with plans for cardioversion in 4 to 6 weeks.  He converted to sinus rhythm prior to cardioversion. He saw Dr. Holts 10/29 in follow-up at which time he was mildly fluid overloaded.  Metoprolol  was reduced to 25 mg twice daily. It was recommended to take Lasix  daily until he return to dry weight of 179 pounds.  He then saw Dr. Gollan 11/14 where he was maintaining sinus rhythm, using Kardia mobile to monitor. Both pulse and BP were borderline. No meds changed.    On follow-up today, he continues to maintain sinus rhythm on 200 mg amiodarone  daily.  He uses Kardia mobile daily.  He continues to take Eliquis  twice daily without bleeding concerns.  He questions whether he can return to exercise.  He denies chest pain, chest pressure, palpitations, or lower extremity edema     Arrhythmia/Device History Amiodarone      ROS:  Please see the history of present illness. All other systems are reviewed and otherwise negative.    Physical Exam    VS:  BP 122/66   Pulse 65   Ht 5' 10 (1.778 m)   Wt 179 lb (81.2 kg)   SpO2 96%   BMI 25.68 kg/m  BMI: Body mass index is 25.68 kg/m.           Wt  Readings from Last 3 Encounters:  05/31/24 179 lb (81.2 kg)  05/19/24 176 lb 8 oz (80.1 kg)  05/03/24 185 lb 9.6 oz (84.2 kg)     GEN- The patient is well appearing, alert and oriented x 3 today.   Lungs- Clear to ausculation bilaterally, normal work of breathing.  Heart- Regular rate and rhythm, murmur appreciated, rubs or gallops Extremities- No peripheral edema, warm, dry   Studies Reviewed   Previous EP, cardiology notes.    EKG is not ordered. Personal review of EKG from 05/19/2024 shows:  SB at 54; low voltage        TTE, 03/10/2024  1. Left ventricular ejection fraction, by estimation, is 55 to 60%. The left ventricle has normal function. There is moderate left ventricular hypertrophy. Left ventricular diastolic parameters are indeterminate. Significant beat-to-beat variability in atrial fibrillation.   2. Right ventricular systolic function is low normal. The right ventricular size is normal. There is mildly elevated pulmonary artery systolic pressure.   3. Mild mitral valve regurgitation.   4. Mild tricuspid regurgitation.   5. Aortic valve regurgitation is mild to moderate. There is paradoxical, low flow-low gradient aortic stenosis which is at least moderate in severity. AVA 0.8-0.9, Vmax 2.8 cm/s, MG 16 mmHg with DVI of 0.32.   Comparison(s): When compared to prior study dated 01/13/2022, there is now evidence of  moderate aortic stenosis.   Conclusion(s)/Recommendation(s): Due to findings of moderate aortic stenosis, a follow up echocardiogram is recommended in 1 year or sooner if clinically indicated.   Long term monitor, 01/31/2024 Patch Wear Time:  14 days and 0 hours (2025-07-03T12:39:22-0400 to 2025-07-17T12:39:22-0400)   Rhythm is normal sinus rhythm with paroxysmal atrial fibrillation Patient had a min HR of 61 bpm, max HR of 192 bpm, and avg HR of 108 bpm.  Atrial Fibrillation occurred (65% burden), ranging from 65-192 bpm (avg of 120 bpm), the longest lasting 8  days 12 hours with an avg rate of 120 bpm. Atrial Fibrillation  was present at activation and de-activation of device.   1 run of Ventricular Tachycardia occurred lasting 4 beats with a max rate of 158 bpm (avg 150 bpm). 1 run of Supraventricular Tachycardia occurred lasting 58.0 secs with a max rate of 182 bpm (avg 158 bpm).    Isolated SVEs were rare (<1.0%), SVE Couplets were rare (<1.0%), and SVE Triplets were rare (<1.0%).    Isolated VEs were rare (<1.0%), VE Couplets were rare (<1.0%), and no VE Triplets were present. Ventricular Bigeminy was present.   No patient triggered events recorded     Assessment and Plan     #) persis aFib #) amiodarone  monitoring Maintaining sinus rhythm Continue to monitor via KardiaMobile We discussed AF ablation vs continuing amiodarone . At this time, patient prefers to continue AAD  Continue 200mg  amiodarone  Continue 25mg  toprol    #) Hypercoag d/t persis afib CHA2DS2-VASc Score = at least 8 [CHF History: 1, HTN History: 1, Diabetes History: 1, Stroke History: 2, Vascular Disease History: 1, Age Score: 2, Gender Score: 0].  Therefore, the patient's annual risk of stroke is 10.8 %.    Stroke ppx - 5mg  eliquis  BID, appropriately dosed No bleeding concerns        Current medicines are reviewed at length with the patient today.   The patient does not have concerns regarding his medicines.  The following changes were made today:  none  Labs/ tests ordered today include:  No orders of the defined types were placed in this encounter.    Disposition: Follow up with EP APP  in 3 months   Signed, Chantal Needle, NP  05/31/24  4:17 PM  Electrophysiology [facility]

## 2024-05-31 ENCOUNTER — Encounter: Payer: Self-pay | Admitting: Cardiology

## 2024-05-31 ENCOUNTER — Ambulatory Visit: Attending: Cardiology | Admitting: Cardiology

## 2024-05-31 VITALS — BP 122/66 | HR 65 | Ht 70.0 in | Wt 179.0 lb

## 2024-05-31 DIAGNOSIS — D6869 Other thrombophilia: Secondary | ICD-10-CM | POA: Diagnosis present

## 2024-05-31 DIAGNOSIS — I480 Paroxysmal atrial fibrillation: Secondary | ICD-10-CM | POA: Insufficient documentation

## 2024-05-31 NOTE — Patient Instructions (Signed)
 Medication Instructions:  Your physician recommends that you continue on your current medications as directed. Please refer to the Current Medication list given to you today.  *If you need a refill on your cardiac medications before your next appointment, please call your pharmacy*  Lab Work: No labs ordered today    Testing/Procedures: No test ordered today   Follow-Up: At [facility], you and your health needs are our priority.  As part of our continuing mission to provide you with exceptional heart care, our providers are all part of one team.  This team includes your primary Cardiologist (physician) and Advanced Practice Providers or APPs (Physician Assistants and Nurse Practitioners) who all work together to provide you with the care you need, when you need it.  Your next appointment:   3 month(s)  Provider:   Suzann Riddle, NP

## 2024-06-20 ENCOUNTER — Ambulatory Visit: Admitting: Physician Assistant

## 2024-06-27 ENCOUNTER — Other Ambulatory Visit: Payer: Self-pay | Admitting: Emergency Medicine

## 2024-06-30 ENCOUNTER — Other Ambulatory Visit: Payer: Self-pay | Admitting: Family Medicine

## 2024-07-03 NOTE — Telephone Encounter (Signed)
 Requested Prescriptions  Pending Prescriptions Disp Refills   esomeprazole  (NEXIUM ) 40 MG capsule [Pharmacy Med Name: ESOMEPRA MAG CAP 40MG  DR] 90 capsule 3    Sig: TAKE 1 CAPSULE DAILY     Gastroenterology: Proton Pump Inhibitors 2 Passed - 07/03/2024 11:34 AM      Passed - ALT in normal range and within 360 days    ALT  Date Value Ref Range Status  04/17/2024 14 9 - 46 U/L Final         Passed - AST in normal range and within 360 days    AST  Date Value Ref Range Status  04/17/2024 16 10 - 35 U/L Final         Passed - Valid encounter within last 12 months    Recent Outpatient Visits           2 months ago Type 2 diabetes mellitus with other specified complication, without long-term current use of insulin [facility])   [place] [facility] [place], Marsa PARAS, DO   3 months ago Paronychia, toe, right   [facility], Leita DEL, MD   4 months ago Paroxysmal atrial fibrillation [facility])   [place] [facility] [place], Marsa PARAS, DO   8 months ago Type 2 diabetes mellitus with other specified complication, without long-term current use of insulin [facility])   [place] [facility] [place], Marsa PARAS, DO       Future Appointments             In 2 months Riddle, Suzann, NP [facility] [place]   In 7 months Hester Alm BROCKS, MD [facility]

## 2024-07-05 ENCOUNTER — Telehealth: Payer: Self-pay | Admitting: Cardiovascular Disease

## 2024-07-05 NOTE — Telephone Encounter (Signed)
" °*  STAT* If patient is at the pharmacy, call can be transferred to refill team.   1. Which medications need to be refilled? (please list name of each medication and dose if known)   apixaban  (ELIQUIS ) 5 MG TABS tablet   2. Would you like to learn more about the convenience, safety, & potential cost savings by using the [facility]?   3. Are you open to using the [facility] (Type [facility]. ).  4. Which pharmacy/location (including street and city if local pharmacy) is medication to be sent to?  [id number] - [place], [place] - [address]   5. Do they need a 30 day or 90 day supply?   Patient stated he has a 4 days left of medication.    "

## 2024-07-11 ENCOUNTER — Telehealth: Payer: Self-pay

## 2024-07-11 ENCOUNTER — Ambulatory Visit: Admitting: Cardiology

## 2024-07-11 NOTE — Telephone Encounter (Signed)
"  Patient is returning call  "

## 2024-07-11 NOTE — Telephone Encounter (Signed)
 Returned call to pt.  He states that the automated system at [facility] is telling him he has no refills.  Pt was advised to call and speak to a live person, that he should have refills remaining.  He verbalized understanding and will call us  back if he needs us .

## 2024-07-11 NOTE — Telephone Encounter (Signed)
 Copied from CRM [id number]. Topic: Referral - Request for Referral >> Jul 11, 2024  3:20 PM Darshell M wrote: Did the patient discuss referral with their provider in the last year? No (If No - schedule appointment) (If Yes - send message)  Appointment offered? Yes  Type of order/referral and detailed reason for visit: Podiatry  Preference of office, provider, location: [place]  If referral order, have you been seen by this specialty before? No (If Yes, this issue or another issue? When? Where?  Can we respond through MyChart? Yes

## 2024-07-11 NOTE — Telephone Encounter (Signed)
 Returned call to pt.  Left a message that we sent in refill for Eliquis  to [facility], 05/30/2023.  I asked pt to call back and let us  know if he still needs this refill.

## 2024-07-26 ENCOUNTER — Encounter: Payer: Self-pay | Admitting: Family Medicine

## 2024-07-26 ENCOUNTER — Other Ambulatory Visit: Payer: Self-pay | Admitting: Family Medicine

## 2024-07-26 DIAGNOSIS — E1165 Type 2 diabetes mellitus with hyperglycemia: Secondary | ICD-10-CM

## 2024-07-26 DIAGNOSIS — N1832 Chronic kidney disease, stage 3b: Secondary | ICD-10-CM

## 2024-07-27 ENCOUNTER — Other Ambulatory Visit

## 2024-07-28 LAB — BASIC METABOLIC PANEL WITH GFR
BUN/Creatinine Ratio: 17 (calc) (ref 6–22)
BUN: 24 mg/dL (ref 7–25)
CO2: 27 mmol/L (ref 20–32)
Calcium: 9.6 mg/dL (ref 8.6–10.3)
Chloride: 101 mmol/L (ref 98–110)
Creat: 1.44 mg/dL — ABNORMAL HIGH (ref 0.70–1.28)
Glucose, Bld: 146 mg/dL — ABNORMAL HIGH (ref 65–99)
Potassium: 5.1 mmol/L (ref 3.5–5.3)
Sodium: 138 mmol/L (ref 135–146)
eGFR: 49 mL/min/1.73m2 — ABNORMAL LOW

## 2024-07-28 LAB — HEMOGLOBIN A1C
Hgb A1c MFr Bld: 6.7 % — ABNORMAL HIGH
Mean Plasma Glucose: 146 mg/dL
eAG (mmol/L): 8.1 mmol/L

## 2024-08-03 ENCOUNTER — Ambulatory Visit: Admitting: Family Medicine

## 2024-08-03 ENCOUNTER — Other Ambulatory Visit: Payer: Self-pay | Admitting: Family Medicine

## 2024-08-03 ENCOUNTER — Encounter: Payer: Self-pay | Admitting: Family Medicine

## 2024-08-03 VITALS — BP 130/68 | HR 77 | Ht 70.0 in | Wt 181.1 lb

## 2024-08-03 DIAGNOSIS — N1831 Chronic kidney disease, stage 3a: Secondary | ICD-10-CM | POA: Diagnosis not present

## 2024-08-03 DIAGNOSIS — Z7984 Long term (current) use of oral hypoglycemic drugs: Secondary | ICD-10-CM

## 2024-08-03 DIAGNOSIS — I129 Hypertensive chronic kidney disease with stage 1 through stage 4 chronic kidney disease, or unspecified chronic kidney disease: Secondary | ICD-10-CM

## 2024-08-03 DIAGNOSIS — G720 Drug-induced myopathy: Secondary | ICD-10-CM | POA: Diagnosis not present

## 2024-08-03 DIAGNOSIS — E1121 Type 2 diabetes mellitus with diabetic nephropathy: Secondary | ICD-10-CM

## 2024-08-03 DIAGNOSIS — I480 Paroxysmal atrial fibrillation: Secondary | ICD-10-CM

## 2024-08-03 DIAGNOSIS — I5032 Chronic diastolic (congestive) heart failure: Secondary | ICD-10-CM

## 2024-08-03 DIAGNOSIS — B351 Tinea unguium: Secondary | ICD-10-CM | POA: Diagnosis not present

## 2024-08-03 DIAGNOSIS — N401 Enlarged prostate with lower urinary tract symptoms: Secondary | ICD-10-CM

## 2024-08-03 DIAGNOSIS — E782 Mixed hyperlipidemia: Secondary | ICD-10-CM

## 2024-08-03 MED ORDER — TERBINAFINE HCL 250 MG PO TABS: 250.0000 mg | ORAL_TABLET | Freq: Every day | ORAL | 0 refills | Status: AC

## 2024-08-03 NOTE — Progress Notes (Signed)
 "  Subjective:    Patient ID: Jorge Mayer, male    DOB: 03-01-1946, 79 y.o.   MRN: [id number]  Jorge Mayer is a 79 y.o. male presenting on 08/03/2024 for Medical Management of Chronic Issues   HPI  Discussed the use of AI scribe software for clinical note transcription with the patient, who gave verbal consent to proceed.  History of Present Illness   Jorge Mayer is a 79 year old male with diabetes and atrial fibrillation who presents for a follow-up visit to check blood sugar and kidney function.  Onychomycosis and foot concerns - Persistent issue with big toe suspected to be fungal in etiology - Over-the-counter antifungal treatments ineffective - Concern regarding impaired healing due to diabetes and family history of diabetes-related complications     Paroxysmal Atrial fibrillation Followed by Cardiology S/p cardioversion and he is on Amiodarone , beta blocker and anticoagulation No further AFib episodes in past 3-4 motnhs  Chronic kidney disease III a eGFR improved on last lab.  Stable kidney function with elevated creatinine levels in blood Previously w/ Nephrology Taking Lasix  20mg  every other day now - History of diabetes and hypertension, both contributing factors to kidney disease  Allergic Rhinosinusitis Occasional OTC anti histamine, not taking regularly Not using nasal spray - Persistent phlegm and post-nasal drip attributed to allergies - Symptoms improve with antihistamines, but prefers not to use them daily  Type 2 Diabetes Improved A1c 6.7 from 8.1 Uses CGM DexcomG7 with good control No recent hypoglycemia - Periodic fasting attributed to improved glycemic control - Occasional postprandial hyperglycemia several hours after eating - Currently using Trijardy  and Dexcom continuous glucose monitor, which is helpful for management - Admits peripheral neuropathy   Hyperlipidemia management 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 - Currently taking atorvastatin  and Zetia  for cholesterol management      04/26/2024   10:03 AM 03/31/2024   11:12 AM 02/08/2024    4:05 PM  Depression screen PHQ 2/9  Decreased Interest 0 0 0  Down, Depressed, Hopeless 0 0 0  PHQ - 2 Score 0 0 0  Altered sleeping 0 1 1  Tired, decreased energy 0 1 2  Change in appetite 0 0 0  Feeling bad or failure about yourself  0 0 0  Trouble concentrating 0 0 0  Moving slowly or fidgety/restless 0 0 0  Suicidal thoughts 0 0 0  PHQ-9 Score 0  2  3   Difficult doing work/chores  Not difficult at all      Data saved with a previous flowsheet row definition       04/26/2024   10:04 AM 03/31/2024   11:12 AM 02/08/2024    4:05 PM 10/28/2023    8:49 AM  GAD 7 : Generalized Anxiety Score  Nervous, Anxious, on Edge 0  0  0  1   Control/stop worrying 0  0  0  1   Worry too much - different things 0  0  0  0   Trouble relaxing 0  0  0  0   Restless 0  0  0  0   Easily annoyed or irritable 0  0  0  0   Afraid - awful might happen 0  0  0  0   Total GAD 7 Score 0 0 0 2  Anxiety Difficulty Not difficult at all Not difficult at all  Not difficult at all     Data saved with a previous  flowsheet row definition    Social History[1]  Review of Systems Per HPI unless specifically indicated above     Objective:    BP 130/68 (BP Location: Left Arm, Patient Position: Sitting, Cuff Size: Normal)   Pulse 77   Ht 5' 10 (1.778 m)   Wt 181 lb 2 oz (82.2 kg)   SpO2 96%   BMI 25.99 kg/m   Wt Readings from Last 3 Encounters:  08/03/24 181 lb 2 oz (82.2 kg)  05/31/24 179 lb (81.2 kg)  05/19/24 176 lb 8 oz (80.1 kg)    Physical Exam Vitals and nursing note reviewed.  Constitutional:      General: He is not in acute distress.    Appearance: He is well-developed. He is not diaphoretic.     Comments: Well-appearing, comfortable, cooperative  HENT:     Head: Normocephalic and atraumatic.  Eyes:     General:        Right eye: No  discharge.        Left eye: No discharge.     Conjunctiva/sclera: Conjunctivae normal.  Neck:     Thyroid : No thyromegaly.  Cardiovascular:     Rate and Rhythm: Normal rate and regular rhythm.     Pulses: Normal pulses.     Heart sounds: Normal heart sounds. No murmur heard. Pulmonary:     Effort: Pulmonary effort is normal. No respiratory distress.     Breath sounds: Normal breath sounds. No wheezing or rales.  Musculoskeletal:        General: Normal range of motion.     Cervical back: Normal range of motion and neck supple.  Lymphadenopathy:     Cervical: No cervical adenopathy.  Skin:    General: Skin is warm and dry.     Findings: Lesion (R great toenail medial discoloration thickening appears fungal) present. No erythema or rash.  Neurological:     Mental Status: He is alert and oriented to person, place, and time. Mental status is at baseline.  Psychiatric:        Behavior: Behavior normal.     Comments: Well groomed, good eye contact, normal speech and thoughts     Results for orders placed or performed in visit on 07/26/24  Hemoglobin A1c   Collection Time: 07/27/24  9:59 AM  Result Value Ref Range   Hgb A1c MFr Bld 6.7 (H) <5.7 %   Mean Plasma Glucose 146 mg/dL   eAG (mmol/L) 8.1 mmol/L  Basic metabolic panel with GFR   Collection Time: 07/27/24  9:59 AM  Result Value Ref Range   Glucose, Bld 146 (H) 65 - 99 mg/dL   BUN 24 7 - 25 mg/dL   Creat 8.55 (H) 9.29 - 1.28 mg/dL   eGFR 49 (L) > OR = 60 mL/min/1.49m2   BUN/Creatinine Ratio 17 6 - 22 (calc)   Sodium 138 135 - 146 mmol/L   Potassium 5.1 3.5 - 5.3 mmol/L   Chloride 101 98 - 110 mmol/L   CO2 27 20 - 32 mmol/L   Calcium  9.6 8.6 - 10.3 mg/dL      Assessment & Plan:   Problem List Items Addressed This Visit     Chronic heart failure with preserved ejection fraction (HFpEF) (HCC)   Drug-induced myopathy   Mixed hyperlipidemia   Stage 3a chronic kidney disease (HCC)   Type 2 diabetes with nephropathy  (HCC) - Primary   Other Visit Diagnoses       Onychomycosis of right great  toe       Relevant Medications   terbinafine  (LAMISIL ) 250 MG tablet     Paroxysmal atrial fibrillation (HCC)            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 Trijardy  and Dexcom CGM effective. Discussed hypoglycemia risks. - Continue Trijardy  and Dexcom CGM Reviewed refills, screenings  Chronic kidney disease, stage 3a Kidney function stable, Trijardy  protective. Previous GFR drop possibly due to dehydration. Now improved. No longer w/ Nephrology - Continue current management and monitoring. - Follow-up in early October.  Hyperlipidemia Drug Induced Myopathy Statin On Zetia  and now tolerating Atorvastatin   Onychomycosis of right great toe Persistent onychomycosis, OTC treatment ineffective. Discussed oral antifungal and potential podiatrist referral. - Prescribed terbinafine  1 tablet daily for 3 months. - Consider podiatrist referral if no improvement.  Allergic rhinitis Symptoms include post-nasal drip and phlegm, worsens at night. Discussed second-generation antihistamines and Atrovent. - Recommend daily second-generation antihistamines. - Consider Atrovent nasal spray if needed.  Paroxysmal atrial fibrillation HFpEF - diastolic Followed by Cardiology No episodes in 3-4 months. Amiodarone  effective. Regular monitoring ongoing. - Continue amiodarone , betablocker - On Anticoagulation Eliquis  - Continue monitoring with Apple Watch and ECG.        No orders of the defined types were placed in this encounter.   Meds ordered this encounter  Medications   terbinafine  (LAMISIL ) 250 MG tablet    Sig: Take 1 tablet (250 mg total) by mouth daily. For 3 month treatment for great toe fungal infection    Dispense:  90 tablet    Refill:  0    Follow up plan: Return in about 8 months (around 04/03/2025) for 8 months fasting lab  then 1 week later Annual Medicare Physical.  Future labs ordered for 03/27/25   Marsa Officer, DO [facility] 08/03/2024, 10:46 AM     [1]  Social History Tobacco Use   Smoking status: Former    Current packs/day: 0.00    Average packs/day: 1 pack/day for 10.0 years (10.0 ttl pk-yrs)    Types: Cigarettes    Start date: 07/06/1964    Quit date: 07/06/1974    Years since quitting: 50.1   Smokeless tobacco: Never  Vaping Use   Vaping status: Never Used  Substance Use Topics   Alcohol use: Yes    Alcohol/week: 1.0 standard drink of alcohol    Types: 1 Standard drinks or equivalent per week   Drug use: Never   "

## 2024-08-03 NOTE — Patient Instructions (Addendum)
 Thank you for coming to the office today.  Terbinafine  for Right great toenail fungal infection likely 250mg  daily for 90 days Let me know if want to consult Podiatry  Recommend daily 2nd gen anti histamine - either Claritin loratadine, zyrtec or cetirizine, or allegra fexofenadine. Once daily to help dry up symptoms.  Can consider nasal spray if want as well to help decongest  Recent Labs    10/21/23 0919 04/17/24 1024 07/27/24 0959  HGBA1C 8.0* 8.1* 6.7*   Keep on Trijardy   DUE for FASTING BLOOD WORK (no food or drink after midnight before the lab appointment, only water or coffee without cream/sugar on the morning of)  SCHEDULE Lab Only visit in the morning at the clinic for lab draw in 8 MONTHS   - Make sure Lab Only appointment is at about 1 week before your next appointment, so that results will be available  For Lab Results, once available within 2-3 days of blood draw, you can can log in to MyChart online to view your results and a brief explanation. Also, we can discuss results at next follow-up visit.   Please schedule a Follow-up Appointment to: Return in about 8 months (around 04/03/2025) for 8 months fasting lab then 1 week later Annual Medicare Physical.  If you have any other questions or concerns, please feel free to call the office or send a message through MyChart. You may also schedule an earlier appointment if necessary.  Additionally, you may be receiving a survey about your experience at our office within a few days to 1 week by e-mail or mail. We value your feedback.  Marsa Officer, DO [facility], [place]

## 2024-09-12 ENCOUNTER — Ambulatory Visit: Admitting: Cardiology

## 2024-09-22 ENCOUNTER — Ambulatory Visit: Admitting: Cardiology

## 2025-02-01 ENCOUNTER — Ambulatory Visit: Admitting: Dermatology

## 2025-03-27 ENCOUNTER — Other Ambulatory Visit

## 2025-04-03 ENCOUNTER — Encounter: Admitting: Family Medicine
